# Patient Record
Sex: Male | Born: 1945 | Race: White | Hispanic: No | Marital: Married | Smoking: Former smoker
Health system: Southern US, Community
[De-identification: ages and names within clinical notes are randomized; demographics above are authoritative.]

## PROBLEM LIST (undated history)

## (undated) DIAGNOSIS — I4891 Unspecified atrial fibrillation: Secondary | ICD-10-CM

## (undated) DIAGNOSIS — I2510 Atherosclerotic heart disease of native coronary artery without angina pectoris: Secondary | ICD-10-CM

## (undated) DIAGNOSIS — I219 Acute myocardial infarction, unspecified: Secondary | ICD-10-CM

## (undated) DIAGNOSIS — E785 Hyperlipidemia, unspecified: Secondary | ICD-10-CM

## (undated) DIAGNOSIS — H9319 Tinnitus, unspecified ear: Secondary | ICD-10-CM

## (undated) DIAGNOSIS — M1990 Unspecified osteoarthritis, unspecified site: Secondary | ICD-10-CM

## (undated) DIAGNOSIS — N529 Male erectile dysfunction, unspecified: Secondary | ICD-10-CM

## (undated) DIAGNOSIS — G4733 Obstructive sleep apnea (adult) (pediatric): Secondary | ICD-10-CM

## (undated) DIAGNOSIS — K5790 Diverticulosis of intestine, part unspecified, without perforation or abscess without bleeding: Secondary | ICD-10-CM

## (undated) DIAGNOSIS — I10 Essential (primary) hypertension: Secondary | ICD-10-CM

## (undated) DIAGNOSIS — E669 Obesity, unspecified: Secondary | ICD-10-CM

## (undated) DIAGNOSIS — E119 Type 2 diabetes mellitus without complications: Secondary | ICD-10-CM

## (undated) HISTORY — PX: CORONARY ANGIOPLASTY: SHX604

## (undated) HISTORY — PX: CORONARY ARTERY BYPASS GRAFT: SHX141

## (undated) HISTORY — PX: OTHER SURGICAL HISTORY: SHX169

## (undated) HISTORY — DX: Diverticulosis of intestine, part unspecified, without perforation or abscess without bleeding: K57.90

## (undated) HISTORY — DX: Hyperlipidemia, unspecified: E78.5

## (undated) HISTORY — DX: Male erectile dysfunction, unspecified: N52.9

## (undated) HISTORY — DX: Obstructive sleep apnea (adult) (pediatric): G47.33

## (undated) HISTORY — DX: Tinnitus, unspecified ear: H93.19

## (undated) HISTORY — PX: VASECTOMY: SHX75

## (undated) HISTORY — DX: Obesity, unspecified: E66.9

## (undated) HISTORY — DX: Essential (primary) hypertension: I10

---

## 1998-08-19 ENCOUNTER — Encounter: Payer: Self-pay | Admitting: Rheumatology

## 1998-08-19 ENCOUNTER — Observation Stay (HOSPITAL_COMMUNITY): Admission: EM | Admit: 1998-08-19 | Discharge: 1998-08-20 | Payer: Self-pay | Admitting: Emergency Medicine

## 1998-08-20 ENCOUNTER — Encounter: Payer: Self-pay | Admitting: Interventional Cardiology

## 2000-04-13 ENCOUNTER — Encounter: Admission: RE | Admit: 2000-04-13 | Discharge: 2000-07-12 | Payer: Self-pay | Admitting: Internal Medicine

## 2001-12-12 ENCOUNTER — Ambulatory Visit (HOSPITAL_COMMUNITY): Admission: RE | Admit: 2001-12-12 | Discharge: 2001-12-12 | Payer: Self-pay | Admitting: Gastroenterology

## 2001-12-12 ENCOUNTER — Encounter (INDEPENDENT_AMBULATORY_CARE_PROVIDER_SITE_OTHER): Payer: Self-pay | Admitting: *Deleted

## 2007-03-22 ENCOUNTER — Encounter: Admission: RE | Admit: 2007-03-22 | Discharge: 2007-03-22 | Payer: Self-pay | Admitting: Internal Medicine

## 2009-12-16 ENCOUNTER — Inpatient Hospital Stay (HOSPITAL_BASED_OUTPATIENT_CLINIC_OR_DEPARTMENT_OTHER): Admission: RE | Admit: 2009-12-16 | Discharge: 2009-12-16 | Payer: Self-pay | Admitting: Interventional Cardiology

## 2010-08-16 LAB — POCT I-STAT GLUCOSE
Glucose, Bld: 251 mg/dL — ABNORMAL HIGH (ref 70–99)
Operator id: 141321

## 2010-09-11 ENCOUNTER — Emergency Department (HOSPITAL_COMMUNITY)
Admission: EM | Admit: 2010-09-11 | Discharge: 2010-09-11 | Disposition: A | Discharge status: Nursing Home (Includes ICF) | Payer: No Typology Code available for payment source | Attending: Emergency Medicine | Admitting: Emergency Medicine

## 2010-09-11 DIAGNOSIS — IMO0002 Reserved for concepts with insufficient information to code with codable children: Secondary | ICD-10-CM | POA: Insufficient documentation

## 2010-09-11 DIAGNOSIS — T23269A Burn of second degree of back of unspecified hand, initial encounter: Secondary | ICD-10-CM | POA: Insufficient documentation

## 2010-09-11 DIAGNOSIS — E119 Type 2 diabetes mellitus without complications: Secondary | ICD-10-CM | POA: Insufficient documentation

## 2010-09-11 DIAGNOSIS — Z79899 Other long term (current) drug therapy: Secondary | ICD-10-CM | POA: Insufficient documentation

## 2010-09-11 DIAGNOSIS — T22239A Burn of second degree of unspecified upper arm, initial encounter: Secondary | ICD-10-CM | POA: Insufficient documentation

## 2010-09-11 DIAGNOSIS — T20219A Burn of second degree of unspecified ear [any part, except ear drum], initial encounter: Secondary | ICD-10-CM | POA: Insufficient documentation

## 2010-09-11 DIAGNOSIS — Z951 Presence of aortocoronary bypass graft: Secondary | ICD-10-CM | POA: Insufficient documentation

## 2010-09-11 DIAGNOSIS — H9209 Otalgia, unspecified ear: Secondary | ICD-10-CM | POA: Insufficient documentation

## 2010-09-11 DIAGNOSIS — Z7982 Long term (current) use of aspirin: Secondary | ICD-10-CM | POA: Insufficient documentation

## 2010-09-11 DIAGNOSIS — T2020XA Burn of second degree of head, face, and neck, unspecified site, initial encounter: Secondary | ICD-10-CM | POA: Insufficient documentation

## 2010-09-11 DIAGNOSIS — Y92009 Unspecified place in unspecified non-institutional (private) residence as the place of occurrence of the external cause: Secondary | ICD-10-CM | POA: Insufficient documentation

## 2010-09-11 LAB — CBC
HCT: 43.4 % (ref 39.0–52.0)
Hemoglobin: 14.8 g/dL (ref 13.0–17.0)
MCHC: 34.1 g/dL (ref 30.0–36.0)
RBC: 4.99 MIL/uL (ref 4.22–5.81)
WBC: 9.9 10*3/uL (ref 4.0–10.5)

## 2010-09-11 LAB — POCT I-STAT, CHEM 8
Creatinine, Ser: 1 mg/dL (ref 0.4–1.5)
HCT: 46 % (ref 39.0–52.0)
Hemoglobin: 15.6 g/dL (ref 13.0–17.0)
Potassium: 4.6 mEq/L (ref 3.5–5.1)
Sodium: 139 mEq/L (ref 135–145)
TCO2: 23 mmol/L (ref 0–100)

## 2010-09-11 LAB — DIFFERENTIAL
Basophils Absolute: 0 10*3/uL (ref 0.0–0.1)
Basophils Relative: 0 % (ref 0–1)
Lymphocytes Relative: 14 % (ref 12–46)
Monocytes Absolute: 0.9 10*3/uL (ref 0.1–1.0)
Neutro Abs: 7.4 10*3/uL (ref 1.7–7.7)
Neutrophils Relative %: 75 % (ref 43–77)

## 2010-10-17 NOTE — Procedures (Signed)
[facility]  Patient:    Joshua Terrell, Joshua Terrell Visit Number: [id number] MRN: [id number]          Service Type: END Location: ENDO Attending Physician:  Dennison Bulla Ii Dictated by:   Verlin Grills, M.D. Proc. Date: 12/12/01 Admit Date:  12/12/2001 Discharge Date: 12/12/2001   CC:         Thora Lance, M.D.   Procedure Report  REFERRING PHYSICIAN:  Thora Lance, M.D.  PROCEDURE:  Screening colonoscopy with rectal polypectomy.  PROCEDURE INDICATION:  Joshua Terrell is a 65 year old male born [birth year].  Joshua Terrell is referred for his first screening colonoscopy with polypectomy to prevent colon cancer.  I discussed with Joshua Terrell the complications associated with colonoscopy and polypectomy, including a 15 per thousand risk of bleeding and four per thousand risk for colon perforation requiring surgical repair.  Joshua Terrell has signed the operative permit.  ENDOSCOPIST:  Verlin Grills, M.D.  PREMEDICATION:  Versed 10 mg, Demerol 100 mg.  ENDOSCOPE:  Olympus pediatric colonoscope.  DESCRIPTION OF PROCEDURE:  After obtaining informed consent, Joshua Terrell was placed in the left lateral decubitus position.  I administered intravenous Demerol and intravenous Versed to achieve conscious sedation for the procedure.  The patients blood pressure, oxygen saturation, and cardiac rhythm were monitored throughout the procedure and documented in the medical record.  Anal inspection was normal.  Digital rectal exam revealed a non-nodular prostate.  The Olympus pediatric video colonoscope was introduced into the rectum and easily advanced to the cecum.  Colonic preparation for the exam today was excellent.  Rectum:  From the midrectum a 1 mm sessile polyp was removed with the cold biopsy forceps.  Sigmoid colon and descending colon:  Left colonic diverticulosis.  Splenic flexure normal.  Transverse colon normal.  Hepatic  flexure normal.  Ascending colon normal.  Cecum and ileocecal valve normal.  ASSESSMENT: 1. Left colonic diverticulosis. 2. From the midrectum a 1 mm sessile polyp was removed with the cold biopsy    forceps.  RECOMMENDATIONS:  Await rectal polyp pathology. Dictated by:   Verlin Grills, M.D. Attending Physician:  Dennison Bulla Ii DD:  12/12/01 TD:  12/14/01 Job: [id number]

## 2011-12-14 ENCOUNTER — Other Ambulatory Visit: Payer: Self-pay | Admitting: Internal Medicine

## 2011-12-14 DIAGNOSIS — K859 Acute pancreatitis without necrosis or infection, unspecified: Secondary | ICD-10-CM

## 2011-12-15 ENCOUNTER — Ambulatory Visit
Admission: RE | Admit: 2011-12-15 | Discharge: 2011-12-15 | Disposition: A | Discharge status: Home / Self Care | Payer: Medicare Other | Source: Ambulatory Visit | Attending: Internal Medicine | Admitting: Internal Medicine

## 2011-12-15 DIAGNOSIS — K859 Acute pancreatitis without necrosis or infection, unspecified: Secondary | ICD-10-CM

## 2013-02-06 ENCOUNTER — Other Ambulatory Visit: Payer: Self-pay | Admitting: Interventional Cardiology

## 2013-02-09 ENCOUNTER — Encounter (HOSPITAL_BASED_OUTPATIENT_CLINIC_OR_DEPARTMENT_OTHER): Payer: Self-pay | Admitting: *Deleted

## 2013-02-09 ENCOUNTER — Encounter (HOSPITAL_BASED_OUTPATIENT_CLINIC_OR_DEPARTMENT_OTHER)
Admission: RE | Disposition: A | Discharge status: Home / Self Care | Payer: Self-pay | Source: Ambulatory Visit | Attending: Interventional Cardiology

## 2013-02-09 ENCOUNTER — Inpatient Hospital Stay (HOSPITAL_BASED_OUTPATIENT_CLINIC_OR_DEPARTMENT_OTHER)
Admission: RE | Admit: 2013-02-09 | Discharge: 2013-02-09 | Disposition: A | Discharge status: Home / Self Care | Payer: Medicare Other | Source: Ambulatory Visit | Attending: Interventional Cardiology | Admitting: Interventional Cardiology

## 2013-02-09 DIAGNOSIS — I2510 Atherosclerotic heart disease of native coronary artery without angina pectoris: Secondary | ICD-10-CM | POA: Insufficient documentation

## 2013-02-09 DIAGNOSIS — I25810 Atherosclerosis of coronary artery bypass graft(s) without angina pectoris: Secondary | ICD-10-CM | POA: Diagnosis present

## 2013-02-09 DIAGNOSIS — I209 Angina pectoris, unspecified: Secondary | ICD-10-CM | POA: Insufficient documentation

## 2013-02-09 DIAGNOSIS — I5030 Unspecified diastolic (congestive) heart failure: Secondary | ICD-10-CM | POA: Insufficient documentation

## 2013-02-09 DIAGNOSIS — I2582 Chronic total occlusion of coronary artery: Secondary | ICD-10-CM | POA: Insufficient documentation

## 2013-02-09 HISTORY — DX: Atherosclerotic heart disease of native coronary artery without angina pectoris: I25.10

## 2013-02-09 HISTORY — DX: Type 2 diabetes mellitus without complications: E11.9

## 2013-02-09 SURGERY — JV LEFT HEART CATHETERIZATION WITH CORONARY/GRAFT ANGIOGRAM

## 2013-02-09 MED ORDER — SODIUM CHLORIDE 0.9 % IJ SOLN: 3.0000 mL | INTRAMUSCULAR | Status: DC | PRN

## 2013-02-09 MED ORDER — SODIUM CHLORIDE 0.9 % IJ SOLN: 3.0000 mL | Freq: Two times a day (BID) | INTRAMUSCULAR | Status: DC

## 2013-02-09 MED ORDER — SODIUM CHLORIDE 0.9 % IV SOLN: 250.0000 mL | INTRAVENOUS | Status: DC | PRN

## 2013-02-09 MED ORDER — ONDANSETRON HCL 4 MG/2ML IJ SOLN: 4.0000 mg | Freq: Four times a day (QID) | INTRAMUSCULAR | Status: DC | PRN

## 2013-02-09 MED ORDER — ACETAMINOPHEN 325 MG PO TABS: 650.0000 mg | ORAL_TABLET | ORAL | Status: DC | PRN

## 2013-02-09 MED ORDER — DIAZEPAM 5 MG PO TABS
5.0000 mg | ORAL_TABLET | ORAL | Status: AC
  Administered 2013-02-09: 5 mg via ORAL

## 2013-02-09 MED ORDER — ASPIRIN 81 MG PO CHEW
324.0000 mg | CHEWABLE_TABLET | ORAL | Status: AC
  Administered 2013-02-09: 324 mg via ORAL

## 2013-02-09 MED ORDER — SODIUM CHLORIDE 0.9 % IV SOLN: INTRAVENOUS | Status: DC

## 2013-02-09 MED ORDER — SODIUM CHLORIDE 0.9 % IV SOLN
INTRAVENOUS | Status: DC
  Administered 2013-02-09: 09:00:00 via INTRAVENOUS

## 2013-02-09 NOTE — H&P (Signed)
  Has history of CAD and CABG with recent complaints suggestive of angina. No newCath Lab Visit (complete for each Cath Lab visit)  Clinical Evaluation Leading to the Procedure:   ACS: no  Non-ACS:    Anginal Classification: CCS III  Anti-ischemic medical therapy: Minimal Therapy (1 class of medications)  Non-Invasive Test Results: No non-invasive testing performed  Prior CABG: Previous CABG       complaints.

## 2013-02-09 NOTE — Progress Notes (Signed)
Pt received from cath procedure alert and denies any pain at this time.  Pt able to tolerate fluids. Dr Katrinka Blazing in to talk to pt and family about the results of test and plan of care.

## 2013-02-09 NOTE — CV Procedure (Signed)
     Diagnostic Cardiac Catheterization Report  Joshua Terrell  67 y.o.  male Sep 23, 1945  Procedure Date: 02/09/2013 Referring Physician: Kirby Funk M.D. Primary Cardiologist:: HWB Leia Alf, M.D.   PROCEDURE:  Left heart catheterization with selective coronary angiography, left ventriculogram.  INDICATIONS:  He is experienced exertional dyspnea and chest tightness with physical activity. This is identical to symptoms noted prior to coronary bypass grafting. Studies being done to document graft patency and help guide therapy.  The risks, benefits, and details of the procedure were explained to the patient.  The patient verbalized understanding and wanted to proceed.  Informed written consent was obtained.  PROCEDURE TECHNIQUE:  After Xylocaine anesthesia a 4 French sheath was placed in the right femoral artery with a single anterior needle wall stick.   Coronary angiography was done using a 4 French left and right Judkins, and an A2 multipurpose catheter.  Left ventriculography was done using an A2 NP catheter.    CONTRAST:  Total of 100 cc.  COMPLICATIONS:  None.    HEMODYNAMICS:  Aortic pressure was 125/73 mmHg; LV pressure was 129 over 19 mmHg; LVEDP 26 mmHg.  There was no gradient between the left ventricle and aorta.    ANGIOGRAPHIC DATA:   The left main coronary artery is widely patent.  The left anterior descending artery is totally occluded beyond the second septal perforator.  The left circumflex artery is totally occluded proximally..  The right coronary artery is totally occluded proximally.Marland Kitchen  LEFT VENTRICULOGRAM:  Left ventricular angiogram was done in the 30 RAO projection and revealed normal left ventricular wall motion and systolic function with an estimated ejection fraction of 50% %. Faint opacification was due to hand injection. LVEDP was elevated.  BYPASS GRAFTS:  Saphenous vein graft to the distal obtuse marginal: Widely patent.  Saphenous vein graft  to the distal right coronary: Widely patent  Saphenous vein graft to the diagonal: The graft is widely patent. The diagonal seems to also supply the mid to distal LAD in a retrograde fashion.  LIMA to LAD: Atretic  IMPRESSIONS:  1. Severe native vessel coronary disease with total occlusion of the mid LAD, proximal circumflex, and proximal RCA.  2. Patent saphenous vein grafts as noted above. X-rays of the left internal mammary graft to the LAD.  3. Low normal LV systolic function with elevated end-diastolic pressure compatible with diastolic heart failure.   RECOMMENDATION:  Medical therapy. Institute treatment targeted at diastolic dysfunction.

## 2013-02-10 LAB — POCT I-STAT GLUCOSE: Operator id: 274661

## 2013-03-15 ENCOUNTER — Ambulatory Visit: Payer: Medicare Other | Admitting: Interventional Cardiology

## 2013-03-16 ENCOUNTER — Encounter: Payer: Self-pay | Admitting: *Deleted

## 2013-03-16 ENCOUNTER — Encounter: Payer: Self-pay | Admitting: Interventional Cardiology

## 2013-03-20 ENCOUNTER — Encounter (INDEPENDENT_AMBULATORY_CARE_PROVIDER_SITE_OTHER): Payer: Self-pay

## 2013-03-20 ENCOUNTER — Ambulatory Visit (INDEPENDENT_AMBULATORY_CARE_PROVIDER_SITE_OTHER): Payer: Medicare Other | Admitting: Interventional Cardiology

## 2013-03-20 ENCOUNTER — Encounter: Payer: Self-pay | Admitting: Interventional Cardiology

## 2013-03-20 VITALS — BP 150/82 | HR 80 | Ht 73.0 in | Wt 305.0 lb

## 2013-03-20 DIAGNOSIS — I25810 Atherosclerosis of coronary artery bypass graft(s) without angina pectoris: Secondary | ICD-10-CM

## 2013-03-20 DIAGNOSIS — I5032 Chronic diastolic (congestive) heart failure: Secondary | ICD-10-CM | POA: Insufficient documentation

## 2013-03-20 DIAGNOSIS — I10 Essential (primary) hypertension: Secondary | ICD-10-CM | POA: Insufficient documentation

## 2013-03-20 DIAGNOSIS — I209 Angina pectoris, unspecified: Secondary | ICD-10-CM

## 2013-03-20 NOTE — Progress Notes (Signed)
Patient ID: Joshua Terrell, male   DOB: 01/09/1946, 67 y.o.   MRN: [id number]    [address] [place], [place]  [address] Phone: [phone] Fax:  [phone]  Date:  03/20/2013   ID:  Joshua Terrell, DOB [birth year], MRN [id number]  PCP:  Lillia Mountain, MD   ASSESSMENT:  1. Coronary atherosclerosis without angina  2. Chronic diastolic heart failure, improved on increased diuretic therapy  3. Hypertension, controlled  4. Diabetes, poorly controlled  5. Morbid obesity  PLAN:  1. increase physical activity, decreased caloric intake, in attempts to lose weight  2. Continue current medical regimen  3. Attempt to improve control of diabetes.  4. Six-month clinical followup   SUBJECTIVE: Joshua Terrell is a 67 y.o. male who is status post catheterization. He has atresia of the LIMA to the LAD. Other grafts are patent. No high-grade proximal disease was felt to be present that requires PCI. Medication adjustments to manage diastolic heart failure were initiated. This included adding spiral lactone. There has been improvement in exertional dyspnea on this medication. He denies angina. There's been a 4 pound weight loss. He is trying to lose weight otherwise but has been unsuccessful..   Wt Readings from Last 3 Encounters:  03/20/13 305 lb (138.347 kg)  02/09/13 309 lb (140.161 kg)  02/09/13 309 lb (140.161 kg)     Past Medical History  Diagnosis Date  . Coronary artery disease     PTCA of the codominant LAD, 1992, CABG July 94  . Diabetes mellitus without complication   . HTN (hypertension)   . Erectile dysfunction   . Tinnitus   . Diverticulosis   . Hyperlipidemia   . Obesity   . OSA (obstructive sleep apnea)     Current Outpatient Prescriptions  Medication Sig Dispense Refill  . amLODipine (NORVASC) 5 MG tablet Take 1 tablet by mouth daily.      Marland Kitchen atorvastatin (LIPITOR) 80 MG tablet Take 1 tablet by mouth daily.      Marland Kitchen glimepiride (AMARYL) 4  MG tablet Take 2 tablets by mouth daily.      Marland Kitchen HYDROcodone-acetaminophen (NORCO/VICODIN) 5-325 MG per tablet TAKE ONE TABLET PRN      . lisinopril-hydrochlorothiazide (PRINZIDE,ZESTORETIC) 20-25 MG per tablet TAKE ONE TABLET DAILY      . metFORMIN (GLUCOPHAGE) 1000 MG tablet Take 1 tablet by mouth 2 (two) times daily.      . metoprolol succinate (TOPROL-XL) 50 MG 24 hr tablet Take 2 tablets by mouth daily.      . Multiple Vitamin (MULTIVITAMIN) capsule Take 1 capsule by mouth daily.      Marland Kitchen spironolactone (ALDACTONE) 25 MG tablet TAKE ONE TABLET DAILY       No current facility-administered medications for this visit.    Allergies:   No Known Allergies  Social History:  The patient  reports that he has quit smoking. He does not have any smokeless tobacco history on file.   ROS:  Please see the history of present illness.   No blood in stool or urine. No edema. No orthopnea PND. Breathing improved since current medication adjustment per   All other systems reviewed and negative.   OBJECTIVE: VS:  BP 150/82  Pulse 80  Ht 6\' 1"  (1.854 m)  Wt 305 lb (138.347 kg)  BMI 40.25 kg/m2  SpO2 94% Well nourished, well developed, in no acute distress HEENT: normal Neck: JVD none. Carotid bruit none  Cardiac:  normal S1,  S2; RRR; no murmur Lungs:  clear to auscultation bilaterally, no wheezing, rhonchi or rales Abd: soft, nontender, no hepatomegaly Ext: Edema none. 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

## 2013-03-20 NOTE — Patient Instructions (Signed)
Your physician recommends that you continue on your current medications as directed. Please refer to the Current Medication list given to you today.  Dr. Katrinka Blazing wants the patient to lower caloric intake and start exercise for weight management.  Your physician wants you to follow-up in: 6 months with Dr. Katrinka Blazing. You will receive a reminder letter in the mail two months in advance. If you don't receive a letter, please call our office to schedule the follow-up appointment.

## 2013-06-09 ENCOUNTER — Other Ambulatory Visit: Payer: Self-pay | Admitting: Orthopaedic Surgery

## 2013-06-09 DIAGNOSIS — IMO0002 Reserved for concepts with insufficient information to code with codable children: Secondary | ICD-10-CM

## 2013-06-20 ENCOUNTER — Ambulatory Visit
Admission: RE | Admit: 2013-06-20 | Discharge: 2013-06-20 | Disposition: A | Discharge status: Home / Self Care | Payer: Medicare HMO | Source: Ambulatory Visit | Attending: Orthopaedic Surgery | Admitting: Orthopaedic Surgery

## 2013-06-20 DIAGNOSIS — IMO0002 Reserved for concepts with insufficient information to code with codable children: Secondary | ICD-10-CM

## 2013-07-24 ENCOUNTER — Other Ambulatory Visit: Payer: Self-pay | Admitting: Interventional Cardiology

## 2013-08-24 ENCOUNTER — Other Ambulatory Visit: Payer: Self-pay | Admitting: Interventional Cardiology

## 2013-08-24 ENCOUNTER — Telehealth: Payer: Self-pay

## 2013-08-24 NOTE — Telephone Encounter (Signed)
pt metoprolol dosage confirmed with pt wife.Metoprolol Succ 50mg  tab 1 1/2 tab(total 75mg  daily)

## 2013-08-24 NOTE — Telephone Encounter (Signed)
Should this be 1.5 daily or 2 daily like is on the chart? Please advise. Thanks, MI

## 2013-12-29 ENCOUNTER — Other Ambulatory Visit: Payer: Self-pay | Admitting: Interventional Cardiology

## 2014-01-01 ENCOUNTER — Other Ambulatory Visit: Payer: Self-pay

## 2014-01-01 MED ORDER — SPIRONOLACTONE 25 MG PO TABS: ORAL_TABLET | ORAL | Status: DC

## 2014-02-22 ENCOUNTER — Ambulatory Visit (INDEPENDENT_AMBULATORY_CARE_PROVIDER_SITE_OTHER): Payer: Medicare HMO | Admitting: Interventional Cardiology

## 2014-02-22 ENCOUNTER — Encounter: Payer: Self-pay | Admitting: Interventional Cardiology

## 2014-02-22 VITALS — BP 110/70 | HR 85 | Ht 73.0 in | Wt 297.0 lb

## 2014-02-22 DIAGNOSIS — I10 Essential (primary) hypertension: Secondary | ICD-10-CM

## 2014-02-22 DIAGNOSIS — I5032 Chronic diastolic (congestive) heart failure: Secondary | ICD-10-CM

## 2014-02-22 DIAGNOSIS — E1165 Type 2 diabetes mellitus with hyperglycemia: Secondary | ICD-10-CM

## 2014-02-22 DIAGNOSIS — IMO0001 Reserved for inherently not codable concepts without codable children: Secondary | ICD-10-CM

## 2014-02-22 DIAGNOSIS — G4733 Obstructive sleep apnea (adult) (pediatric): Secondary | ICD-10-CM

## 2014-02-22 DIAGNOSIS — I209 Angina pectoris, unspecified: Secondary | ICD-10-CM

## 2014-02-22 DIAGNOSIS — I25810 Atherosclerosis of coronary artery bypass graft(s) without angina pectoris: Secondary | ICD-10-CM

## 2014-02-22 NOTE — Progress Notes (Signed)
Patient ID: Joshua Terrell, male   DOB: 01-10-1946, 68 y.o.   MRN: [id number]    [address], [address] [place], [place]  [address] Phone: [phone] Fax:  [phone]  Date:  02/22/2014   ID:  KORD MONETTE, DOB 1945/07/11, MRN [id number]  PCP:  Irven Shelling, MD   ASSESSMENT:  1. Coronary atherosclerotic heart disease, stable without angina. Most recent cardiac catheterization 2012 2. Severe obstructive sleep apnea treated with CPAP 3. Hypertension, controlled 4. Type 2 diabetes, uncontrolled, with recent addition of Bydureon 5. Hyperlipidemia with LDL at target in May  PLAN:  1. Continue current cardiac therapy 2. Increase physical activity and lose weight 3. Clinical followup in one year 4. He should call if angina   SUBJECTIVE: Joshua Terrell is a 68 y.o. male who is doing relatively well. He is limited by significant lower back discomfort. Despite this he maintains a relatively active lifestyle working in his yard and hunting out in the woods. He has not had orthopnea, PND, or palpitations. He denies edema. He has not had syncope. No features of his lower problems found like claudication.   Wt Readings from Last 3 Encounters:  02/22/14 297 lb (134.718 kg)  03/20/13 305 lb (138.347 kg)  02/09/13 309 lb (140.161 kg)     Past Medical History  Diagnosis Date  . Coronary artery disease     PTCA of the codominant LAD, 1992, CABG July 94  . Diabetes mellitus without complication   . HTN (hypertension)   . Erectile dysfunction   . Tinnitus   . Diverticulosis   . Hyperlipidemia   . Obesity   . OSA (obstructive sleep apnea)     Current Outpatient Prescriptions  Medication Sig Dispense Refill  . amLODipine (NORVASC) 5 MG tablet Take 1 tablet by mouth daily.      Marland Kitchen aspirin 81 MG tablet Take 81 mg by mouth daily.      Marland Kitchen atorvastatin (LIPITOR) 80 MG tablet Take 1 tablet by mouth daily.      Marland Kitchen HYDROcodone-acetaminophen (NORCO/VICODIN) 5-325 MG per tablet TAKE  ONE TABLET PRN      . lisinopril-hydrochlorothiazide (PRINZIDE,ZESTORETIC) 20-25 MG per tablet TAKE ONE TABLET DAILY      . metFORMIN (GLUCOPHAGE) 1000 MG tablet Take 1 tablet by mouth 2 (two) times daily.      . metoprolol succinate (TOPROL-XL) 50 MG 24 hr tablet TAKE 1 AND 1/2 TABLETS BY MOUTH ONCE DAILY  45 tablet  8  . Multiple Vitamin (MULTIVITAMIN) capsule Take 1 capsule by mouth daily.      Marland Kitchen spironolactone (ALDACTONE) 25 MG tablet TAKE 1 TABLET BY MOUTH ONCE DAILY  90 tablet  0   No current facility-administered medications for this visit.    Allergies:   No Known Allergies  Social History:  The patient  reports that he has quit smoking. He does not have any smokeless tobacco history on file.   ROS:  Please see the history of present illness.   No transient neurological symptoms. No blood in urine or stool.   All other systems reviewed and negative.   OBJECTIVE: VS:  BP 110/70  Pulse 85  Ht 6\' 1"  (1.854 m)  Wt 297 lb (134.718 kg)  BMI 39.19 kg/m2 Well nourished, well developed, in no acute distress, obese HEENT: normal Neck: JVD flat. Carotid bruit absent  Cardiac:  normal S1, S2; RRR; no murmur Lungs:  clear to auscultation bilaterally, no wheezing, rhonchi or rales Abd:  soft, nontender, no hepatomegaly Ext: Edema absent. Pulses 2+ Skin: warm and dry Neuro:  CNs 2-12 intact, no focal abnormalities noted  EKG:  Normal with first degree AV block       Signed, Illene Labrador III, MD 02/22/2014 2:37 PM

## 2014-02-22 NOTE — Patient Instructions (Signed)
Your physician recommends that you continue on your current medications as directed. Please refer to the Current Medication list given to you today.  Your physician wants you to follow-up in: 1 year with Dr.Smith You will receive a reminder letter in the mail two months in advance. If you don't receive a letter, please call our office to schedule the follow-up appointment.  

## 2014-05-01 ENCOUNTER — Other Ambulatory Visit: Payer: Self-pay | Admitting: Interventional Cardiology

## 2014-06-06 ENCOUNTER — Other Ambulatory Visit: Payer: Self-pay | Admitting: Interventional Cardiology

## 2014-08-29 ENCOUNTER — Other Ambulatory Visit: Payer: Self-pay | Admitting: Interventional Cardiology

## 2014-12-31 ENCOUNTER — Other Ambulatory Visit: Payer: Self-pay | Admitting: Interventional Cardiology

## 2015-02-23 ENCOUNTER — Other Ambulatory Visit: Payer: Self-pay | Admitting: Interventional Cardiology

## 2015-04-29 ENCOUNTER — Other Ambulatory Visit: Payer: Self-pay | Admitting: Interventional Cardiology

## 2015-04-30 ENCOUNTER — Other Ambulatory Visit: Payer: Self-pay | Admitting: Interventional Cardiology

## 2015-05-03 ENCOUNTER — Other Ambulatory Visit: Payer: Self-pay | Admitting: Interventional Cardiology

## 2015-05-14 ENCOUNTER — Ambulatory Visit (INDEPENDENT_AMBULATORY_CARE_PROVIDER_SITE_OTHER): Payer: PPO | Admitting: Interventional Cardiology

## 2015-05-14 ENCOUNTER — Encounter: Payer: Self-pay | Admitting: Interventional Cardiology

## 2015-05-14 VITALS — BP 132/84 | HR 83 | Ht 73.0 in | Wt 299.6 lb

## 2015-05-14 DIAGNOSIS — I25810 Atherosclerosis of coronary artery bypass graft(s) without angina pectoris: Secondary | ICD-10-CM | POA: Diagnosis not present

## 2015-05-14 DIAGNOSIS — I10 Essential (primary) hypertension: Secondary | ICD-10-CM | POA: Diagnosis not present

## 2015-05-14 DIAGNOSIS — G4733 Obstructive sleep apnea (adult) (pediatric): Secondary | ICD-10-CM

## 2015-05-14 DIAGNOSIS — I5032 Chronic diastolic (congestive) heart failure: Secondary | ICD-10-CM | POA: Diagnosis not present

## 2015-05-14 NOTE — Progress Notes (Signed)
Cardiology Office Note   Date:  05/14/2015   ID:  Joshua Terrell, DOB Oct 25, 1945, MRN [id number]  PCP:  Irven Shelling, MD  Cardiologist:  [name], MD   Chief Complaint  Patient presents with  . Coronary Artery Disease       History of Present Illness: Joshua Terrell is a 69 y.o. male who presents for CAD with CABG 1994 including SVG to OM, SVG to RCA, and SVG to diagonal. LIMA to LAD is atretic.  He is discouraged about weight gain. He denies angina. He has not had syncope. He does have sleep apnea and wears C Pap. He is encouraged to continue to wear his device.  We discussed daily physical activity as much as tolerable depending upon his back.  He has occasional fleeting chest pain but none of anginal quality.  Past Medical History  Diagnosis Date  . Coronary artery disease     PTCA of the codominant LAD, 1992, CABG July 94  . Diabetes mellitus without complication ([place])   . HTN (hypertension)   . Erectile dysfunction   . Tinnitus   . Diverticulosis   . Hyperlipidemia   . Obesity   . OSA (obstructive sleep apnea)     Past Surgical History  Procedure Laterality Date  . Coronary angioplasty    . Coronary artery bypass graft    . Vasectomy       Current Outpatient Prescriptions  Medication Sig Dispense Refill  . amLODipine (NORVASC) 5 MG tablet Take 1 tablet by mouth daily.    Marland Kitchen aspirin 81 MG tablet Take 81 mg by mouth daily.    Marland Kitchen atorvastatin (LIPITOR) 80 MG tablet Take 1 tablet by mouth daily.    . Exenatide ER (BYDUREON) 2 MG PEN Inject 2 mg into the skin once a week.    Marland Kitchen HYDROcodone-acetaminophen (NORCO/VICODIN) 5-325 MG per tablet TAKE ONE TABLET PRN    . lisinopril-hydrochlorothiazide (PRINZIDE,ZESTORETIC) 20-25 MG per tablet TAKE ONE TABLET DAILY    . metFORMIN (GLUCOPHAGE) 1000 MG tablet Take 1 tablet by mouth 2 (two) times daily.    . metoprolol succinate (TOPROL-XL) 50 MG 24 hr tablet TAKE 1 AND 1/2 TABLETS BY MOUTH ONCE DAILY 45  tablet 0  . Multiple Vitamin (MULTIVITAMIN) capsule Take 1 capsule by mouth daily.    Marland Kitchen spironolactone (ALDACTONE) 25 MG tablet TAKE 1 TABLET BY MOUTH ONCE DAILY 90 tablet 0   No current facility-administered medications for this visit.    Allergies:   Review of patient's allergies indicates no known allergies.    Social History:  The patient  reports that he has quit smoking. He has never used smokeless tobacco.   Family History:  The patient's family history includes Healthy in his mother, sister, and sister; Heart attack in his father; Heart disease in his father; Other in his brother.    ROS:  Please see the history of present illness.   Otherwise, review of systems are positive for excessive fatigue, dyspnea on exertion, low back discomfort that limits mobility..   All other systems are reviewed and negative.     PHYSICAL EXAM: VS:  BP 132/84 mmHg  Pulse 83  Ht 6\' 1"  (1.854 m)  Wt 299 lb 9.6 oz (135.898 kg)  BMI 39.54 kg/m2 , BMI Body mass index is 39.54 kg/(m^2). GEN: Well nourished, well developed, in no acute distress. Morbidly obese HEENT: normal Neck: no JVD, carotid bruits, or masses Cardiac: RRR.  There is no murmur,  rub, or gallop. There is no edema. Respiratory:  clear to auscultation bilaterally, normal work of breathing. GI: soft, nontender, nondistended, + BS MS: no deformity or atrophy Skin: warm and dry, no rash Neuro:  Strength and sensation are intact Psych: euthymic mood, full affect   EKG:  EKG is performed and reveals sinus rhythm with first-degree AV block (232 ms). Otherwise unremarkable.   Recent Labs: No results found for requested labs within last 365 days.    Lipid Panel No results found for: CHOL, TRIG, HDL, CHOLHDL, VLDL, LDLCALC, LDLDIRECT    Wt Readings from Last 3 Encounters:  05/14/15 299 lb 9.6 oz (135.898 kg)  02/22/14 297 lb (134.718 kg)  03/20/13 305 lb (138.347 kg)      Other studies Reviewed: Additional studies/ records  that were reviewed today include: Reviewed all electronic health records. The findings include no new data is found..    ASSESSMENT AND PLAN:  1. Coronary artery disease involving coronary bypass graft of native heart without angina pectoris Asymptomatic with reference to angina. Known to have an atretic LAD. Cath last performed 2014.  2. Essential hypertension, benign Excellent control  3. Chronic diastolic heart failure (HCC) No evidence of volume overload  4. Severe obstructive sleep apnea Obese. Compliant with therapy.    Current medicines are reviewed at length with the patient today.  The patient has the following concerns regarding medicines: None. He is concerned about his weight..  The following changes/actions have been instituted:    Decrease carbohydrates and diet. Increase physical activity.  Weight loss.  Labs/ tests ordered today include:  No orders of the defined types were placed in this encounter.     Disposition:   FU with HS in 1 year  Signed, [name], MD  05/14/2015 9:02 AM    [facility] [place], [place], [place]  [address] Phone: [phone]; Fax: [phone]

## 2015-05-14 NOTE — Patient Instructions (Signed)
Medication Instructions:  Your physician recommends that you continue on your current medications as directed. Please refer to the Current Medication list given to you today.   Labwork: None ordered  Testing/Procedures: None ordered  Follow-Up: Your physician wants you to follow-up in: 1 year with Dr.Smith You will receive a reminder letter in the mail two months in advance. If you don't receive a letter, please call our office to schedule the follow-up appointment.   Any Other Special Instructions Will Be Listed Below (If Applicable). Your physician discussed the importance of regular exercise and recommended that you start or continue a regular exercise program for good health.  Your physician encouraged you to lose weight for better health.  Avoid foods that are white i.e. Potatoes,Bread, Rice, Pasta      If you need a refill on your cardiac medications before your next appointment, please call your pharmacy.

## 2015-05-25 ENCOUNTER — Other Ambulatory Visit: Payer: Self-pay | Admitting: Interventional Cardiology

## 2015-06-14 DIAGNOSIS — G4733 Obstructive sleep apnea (adult) (pediatric): Secondary | ICD-10-CM | POA: Diagnosis not present

## 2015-06-14 DIAGNOSIS — I10 Essential (primary) hypertension: Secondary | ICD-10-CM | POA: Diagnosis not present

## 2015-06-14 DIAGNOSIS — Z794 Long term (current) use of insulin: Secondary | ICD-10-CM | POA: Diagnosis not present

## 2015-06-14 DIAGNOSIS — E1165 Type 2 diabetes mellitus with hyperglycemia: Secondary | ICD-10-CM | POA: Diagnosis not present

## 2015-06-14 DIAGNOSIS — M5136 Other intervertebral disc degeneration, lumbar region: Secondary | ICD-10-CM | POA: Diagnosis not present

## 2015-07-03 DIAGNOSIS — M25511 Pain in right shoulder: Secondary | ICD-10-CM | POA: Diagnosis not present

## 2015-07-25 ENCOUNTER — Other Ambulatory Visit: Payer: Self-pay | Admitting: Interventional Cardiology

## 2015-08-07 DIAGNOSIS — M4806 Spinal stenosis, lumbar region: Secondary | ICD-10-CM | POA: Diagnosis not present

## 2015-08-07 DIAGNOSIS — M5440 Lumbago with sciatica, unspecified side: Secondary | ICD-10-CM | POA: Diagnosis not present

## 2015-08-07 DIAGNOSIS — M25511 Pain in right shoulder: Secondary | ICD-10-CM | POA: Diagnosis not present

## 2015-10-08 DIAGNOSIS — R21 Rash and other nonspecific skin eruption: Secondary | ICD-10-CM | POA: Diagnosis not present

## 2015-10-08 DIAGNOSIS — K591 Functional diarrhea: Secondary | ICD-10-CM | POA: Diagnosis not present

## 2015-10-21 DIAGNOSIS — Z7984 Long term (current) use of oral hypoglycemic drugs: Secondary | ICD-10-CM | POA: Diagnosis not present

## 2015-10-21 DIAGNOSIS — Z1389 Encounter for screening for other disorder: Secondary | ICD-10-CM | POA: Diagnosis not present

## 2015-10-21 DIAGNOSIS — B356 Tinea cruris: Secondary | ICD-10-CM | POA: Diagnosis not present

## 2015-10-21 DIAGNOSIS — E119 Type 2 diabetes mellitus without complications: Secondary | ICD-10-CM | POA: Diagnosis not present

## 2015-10-21 DIAGNOSIS — E7800 Pure hypercholesterolemia, unspecified: Secondary | ICD-10-CM | POA: Diagnosis not present

## 2015-10-21 DIAGNOSIS — Z6841 Body Mass Index (BMI) 40.0 and over, adult: Secondary | ICD-10-CM | POA: Diagnosis not present

## 2015-10-21 DIAGNOSIS — I10 Essential (primary) hypertension: Secondary | ICD-10-CM | POA: Diagnosis not present

## 2015-10-21 DIAGNOSIS — M5136 Other intervertebral disc degeneration, lumbar region: Secondary | ICD-10-CM | POA: Diagnosis not present

## 2015-10-21 DIAGNOSIS — Z0000 Encounter for general adult medical examination without abnormal findings: Secondary | ICD-10-CM | POA: Diagnosis not present

## 2015-10-21 DIAGNOSIS — E1165 Type 2 diabetes mellitus with hyperglycemia: Secondary | ICD-10-CM | POA: Diagnosis not present

## 2015-11-12 ENCOUNTER — Encounter: Payer: Self-pay | Admitting: Cardiology

## 2015-11-12 DIAGNOSIS — R0789 Other chest pain: Secondary | ICD-10-CM | POA: Diagnosis not present

## 2015-11-12 DIAGNOSIS — I4891 Unspecified atrial fibrillation: Secondary | ICD-10-CM | POA: Diagnosis not present

## 2015-11-12 DIAGNOSIS — R0609 Other forms of dyspnea: Secondary | ICD-10-CM | POA: Diagnosis not present

## 2015-11-12 DIAGNOSIS — I4892 Unspecified atrial flutter: Secondary | ICD-10-CM | POA: Diagnosis not present

## 2015-11-12 DIAGNOSIS — I959 Hypotension, unspecified: Secondary | ICD-10-CM | POA: Diagnosis not present

## 2015-11-12 NOTE — Progress Notes (Addendum)
Cardiology Office Note   Date:  11/13/2015   ID:  Joshua Terrell, DOB 04-07-1946, MRN [id number]  PCP:  Irven Shelling, MD  Cardiologist:  Dr. Tamala Julian    Chief Complaint  Patient presents with  . Fatigue    chest tightness, hypotension, a flutter.       History of Present Illness: Joshua Terrell is a 70 y.o. male who presents for atrial flutter.  Dr. Laurann Montana referred him to [place].   He has a hx. Of CAD with CABG 1994 including SVG to OM, SVG to RCA, and SVG to diagonal last cath 2014. LIMA to LAD is atretic. He does have sleep apnea and wears C Pap.    Has done very well since CABG until SAT. Was [place] and with walking became dizzy and weak.  No chest tightness then.  He went home and BP 70 systolic.  He was drinkng lots of fluids.   He was seen by Dr. Laurann Montana yesterday and his amlodipine, and spironolactone were stopped.  His toprol was decreased from 75 to 50 mg daily.  He was started on Eliquis for CHA2DS2VASc score of 4.  No recent history of any bleeding.    Today he is feeling much better though still weak but his "brain fog" has cleared.  Still with some weakness and chest tightness.        Past Medical History  Diagnosis Date  . Coronary artery disease     PTCA of the codominant LAD, 1992, CABG July 94  . Diabetes mellitus without complication ([place])   . HTN (hypertension)   . Erectile dysfunction   . Tinnitus   . Diverticulosis   . Hyperlipidemia   . Obesity   . OSA (obstructive sleep apnea)     Past Surgical History  Procedure Laterality Date  . Coronary angioplasty    . Coronary artery bypass graft    . Vasectomy       Current Outpatient Prescriptions  Medication Sig Dispense Refill  . apixaban (ELIQUIS) 5 MG TABS tablet Take 5 mg by mouth 2 (two) times daily.    Marland Kitchen atorvastatin (LIPITOR) 80 MG tablet Take 1 tablet by mouth daily.    . Exenatide ER (BYDUREON) 2 MG PEN Inject 2 mg into the skin once a week.    Marland Kitchen lisinopril-hydrochlorothiazide  (PRINZIDE,ZESTORETIC) 20-12.5 MG tablet Take 1 tablet by mouth daily.    . metFORMIN (GLUCOPHAGE) 1000 MG tablet Take 0.5 tablets by mouth 2 (two) times daily.     . metoprolol succinate (TOPROL-XL) 50 MG 24 hr tablet Take 50 mg by mouth daily. Take with or immediately following a meal.    . Multiple Vitamin (MULTIVITAMIN) capsule Take 1 capsule by mouth daily.    Marland Kitchen oxyCODONE-acetaminophen (PERCOCET/ROXICET) 5-325 MG tablet Take 1 tablet by mouth daily. Take 1 tablet every 4-6 hours as needed for pain     No current facility-administered medications for this visit.    Allergies:   Review of patient's allergies indicates no known allergies.    Social History:  The patient  reports that he has quit smoking. He has never used smokeless tobacco.   Family History:  The patient's family history includes Healthy in his mother, sister, and sister; Heart attack in his father; Heart disease in his father; Other in his brother.    ROS:  General:no colds or fevers, mild weight changes Skin:no rashes or ulcers HEENT:no blurred vision, no congestion CV:see HPI PUL:see HPI GI:no diarrhea  constipation or melena, no indigestion GU:no hematuria, no dysuria MS:no joint pain, no claudication Neuro:no syncope, no lightheadedness Endo:+ diabetes, no thyroid disease  Wt Readings from Last 3 Encounters:  11/13/15 301 lb 6.4 oz (136.714 kg)  05/14/15 299 lb 9.6 oz (135.898 kg)  02/22/14 297 lb (134.718 kg)     PHYSICAL EXAM: VS:  BP 136/78 mmHg  Pulse 78  Ht 6\' 1"  (1.854 m)  Wt 301 lb 6.4 oz (136.714 kg)  BMI 39.77 kg/m2  SpO2 96% , BMI Body mass index is 39.77 kg/(m^2). General:Pleasant affect, NAD Skin:Warm and dry, brisk capillary refill HEENT:normocephalic, sclera clear, mucus membranes moist Neck:supple, no JVD, no bruits  Heart:irreg irreg  without murmur, gallup, rub or click Lungs:clear without rales, rhonchi, or wheezes [id number], non tender, + BS, do not palpate liver spleen or  masses Ext:no lower ext edema, 2+ pedal pulses, 2+ radial pulses Neuro:alert and oriented X 3, MAE, follows commands, + facial symmetry    EKG:  EKG is ordered today. The ekg ordered today demonstrates a flutter with HR of 88 but at times with HR brief pauses.  No acute ST changes.    Recent Labs: No results found for requested labs within last 365 days.    Lipid Panel No results found for: CHOL, TRIG, HDL, CHOLHDL, VLDL, LDLCALC, LDLDIRECT     Other studies Reviewed: Additional studies/ records that were reviewed today include: OV notes. .   ASSESSMENT AND PLAN:  1.  Chest tightness most likely from flutter will plan lexiscan myoview post DCCV. I will review with Dr. Tamala Julian.   2.  A flutter with Bradycardia now improved continue current dose of toprol.  Discussed with Dr. Burt Knack and will plan for TEE DCCV as pt continues to be symptomatic.  He will continue eliquis. Instructed not to miss any doses.   I did stop ASA for now.  He will follow up with EP to eval, may be a candidate for ablation.    [facility] has been requested to perform a transesophageal echocardiogram on Joshua Terrell for 11/18/15.  After careful review of history and examination, the risks and benefits of transesophageal echocardiogram have been explained including risks of esophageal damage, perforation (1:10,000 risk), bleeding, pharyngeal hematoma as well as other potential complications associated with conscious sedation including aspiration, arrhythmia, respiratory failure and death. Alternatives to treatment were discussed, questions were answered. Patient is willing to proceed.   I discussed cardioversion with pt and possibility of severe bradycardia post procedure as risk and that anesthesia may cause side effect , both that could cause at least overnight stay.     3. Coronary artery disease involving coronary bypass graft of native heart without angina pectoris Chest tightness.. Known to have an  atretic LAD. Cath last performed 2014.  4. Essential hypertension, benign Excellent control to low. Currently stable.   5. Chronic diastolic heart failure (HCC) No evidence of volume overload  6. Severe obstructive sleep apnea Obese. Compliant with therapy. Current medicines are reviewed with the patient today.  The patient Has no concerns regarding medicines.  The following changes have been made:  See above Labs/ tests ordered today include:see above  Disposition:   FU:  see above  Signed, Cecilie Kicks, NP  11/13/2015 2:16 PM    [facility] [place] [place], [place] Phone: [phone]; Fax: [phone]

## 2015-11-13 ENCOUNTER — Encounter: Payer: Self-pay | Admitting: *Deleted

## 2015-11-13 ENCOUNTER — Ambulatory Visit (INDEPENDENT_AMBULATORY_CARE_PROVIDER_SITE_OTHER): Payer: PPO | Admitting: Cardiology

## 2015-11-13 ENCOUNTER — Other Ambulatory Visit: Payer: Self-pay

## 2015-11-13 ENCOUNTER — Encounter: Payer: Self-pay | Admitting: Cardiology

## 2015-11-13 VITALS — BP 136/78 | HR 78 | Ht 73.0 in | Wt 301.4 lb

## 2015-11-13 DIAGNOSIS — I4892 Unspecified atrial flutter: Secondary | ICD-10-CM | POA: Diagnosis not present

## 2015-11-13 DIAGNOSIS — I25810 Atherosclerosis of coronary artery bypass graft(s) without angina pectoris: Secondary | ICD-10-CM | POA: Diagnosis not present

## 2015-11-13 DIAGNOSIS — I10 Essential (primary) hypertension: Secondary | ICD-10-CM | POA: Diagnosis not present

## 2015-11-13 DIAGNOSIS — I5032 Chronic diastolic (congestive) heart failure: Secondary | ICD-10-CM | POA: Diagnosis not present

## 2015-11-13 DIAGNOSIS — R0789 Other chest pain: Secondary | ICD-10-CM

## 2015-11-13 LAB — CBC WITH DIFFERENTIAL/PLATELET
BASOS PCT: 1 %
Basophils Absolute: 62 cells/uL (ref 0–200)
EOS PCT: 4 %
Eosinophils Absolute: 248 cells/uL (ref 15–500)
HCT: 39.8 % (ref 38.5–50.0)
HEMOGLOBIN: 13.2 g/dL (ref 13.2–17.1)
LYMPHS ABS: 1302 {cells}/uL (ref 850–3900)
LYMPHS PCT: 21 %
MCH: 29.2 pg (ref 27.0–33.0)
MCHC: 33.2 g/dL (ref 32.0–36.0)
MCV: 88.1 fL (ref 80.0–100.0)
MPV: 10.1 fL (ref 7.5–12.5)
Monocytes Absolute: 558 cells/uL (ref 200–950)
Monocytes Relative: 9 %
NEUTROS PCT: 65 %
Neutro Abs: 4030 cells/uL (ref 1500–7800)
Platelets: 248 10*3/uL (ref 140–400)
RBC: 4.52 MIL/uL (ref 4.20–5.80)
RDW: 14.4 % (ref 11.0–15.0)
WBC: 6.2 10*3/uL (ref 3.8–10.8)

## 2015-11-13 LAB — BASIC METABOLIC PANEL
BUN: 23 mg/dL (ref 7–25)
CALCIUM: 9.2 mg/dL (ref 8.6–10.3)
CO2: 23 mmol/L (ref 20–31)
Chloride: 103 mmol/L (ref 98–110)
Creat: 1.28 mg/dL — ABNORMAL HIGH (ref 0.70–1.18)
GLUCOSE: 211 mg/dL — AB (ref 65–99)
Potassium: 5 mmol/L (ref 3.5–5.3)
SODIUM: 140 mmol/L (ref 135–146)

## 2015-11-13 LAB — PROTIME-INR
INR: 1
PROTHROMBIN TIME: 10.4 s (ref 9.0–11.5)

## 2015-11-13 NOTE — Patient Instructions (Addendum)
Medication Instructions:  Your physician has recommended you make the following change in your medication:  1.  STOP the Aspirin   Labwork: TODAY:  BMET, TSH, CBC W/DIFF, & PT/INR  Testing/Procedures: Your physician has requested that you have a TEE/Cardioversion. During a TEE, sound waves are used to create images of your heart. It provides your doctor with information about the size and shape of your heart and how well your heart's chambers and valves are working. In this test, a transducer is attached to the end of a flexible tube that is guided down you throat and into your esophagus (the tube leading from your mouth to your stomach) to get a more detailed image of your heart. Once the TEE has determined that a blood clot is not present, the cardioversion begins. Electrical Cardioversion uses a jolt of electricity to your heart either through paddles or wired patches attached to your chest. This is a controlled, usually prescheduled, procedure. This procedure is done at the hospital and you are not awake during the procedure. You usually go home the day of the procedure. Please see the instruction sheet given to you today for more information.  Your physician has requested that you have a lexiscan myoview in 2-3 WEEKS.  For further information please visit [email or web address]. Please follow instruction sheet, as given.   Follow-Up: Your physician recommends that you schedule a follow-up appointment in:  3-4 WITH 1ST AVAILABLE EP SPECIALIST FOR BRADYCARIDA/A-FLUTTER  Any Other Special Instructions Will Be Listed Below (If Applicable).  Transesophageal Echocardiogram Transesophageal echocardiography (TEE) is a special type of test that produces images of the heart by using sound waves (echocardiogram). This type of echocardiography can obtain better images of the heart than standard echocardiography. TEE is done by passing a flexible tube down the esophagus. The heart is located in front of the  esophagus. Because the heart and esophagus are close to one another, your health care provider can take very clear, detailed pictures of the heart via ultrasound waves. TEE may be done:  If your health care provider needs more information based on standard echocardiography findings.  If you had a stroke. This might have happened because a clot formed in your heart. TEE can visualize different areas of the heart and check for clots.  To check valve anatomy and function.  To check for infection on the inside of your heart (endocarditis).  To evaluate the dividing wall (septum) of the heart and presence of a hole that did not close after birth (patent foramen ovale or atrial septal defect).  To help diagnose a tear in the wall of the aorta (aortic dissection).  During cardiac valve surgery. This allows the surgeon to assess the valve repair before closing the chest.  During a variety of other cardiac procedures to guide positioning of catheters.  Sometimes before a cardioversion, which is a shock to convert heart rhythm back to normal. LET [facility] CARE PROVIDER KNOW ABOUT:   Any allergies you have.  All medicines you are taking, including vitamins, herbs, eye drops, creams, and over-the-counter medicines.  Previous problems you or members of your family have had with the use of anesthetics.  Any blood disorders you have.  Previous surgeries you have had.  Medical conditions you have.  Swallowing difficulties.  An esophageal obstruction. RISKS AND COMPLICATIONS  Generally, TEE is a safe procedure. However, as with any procedure, complications can occur. Possible complications include an esophageal tear (rupture). BEFORE THE PROCEDURE   Do not eat  or drink for 6 hours before the procedure or as directed by your health care provider.  Arrange for someone to drive you home after the procedure. Do not drive yourself home. During the procedure, you will be given medicines that  can continue to make you feel drowsy and can impair your reflexes.  An IV access tube will be started in the arm. PROCEDURE   A medicine to help you relax (sedative) will be given through the IV access tube.  A medicine may be sprayed or gargled to numb the back of the throat.  Your blood pressure, heart rate, and breathing (vital signs) will be monitored during the procedure.  The TEE probe is a long, flexible tube. The tip of the probe is placed into the back of the mouth, and you will be asked to swallow. This helps to pass the tip of the probe into the esophagus. Once the tip of the probe is in the correct area, your health care provider can take pictures of the heart.  TEE is usually not a painful procedure. You may feel the probe press against the back of the throat. The probe does not enter the trachea and does not affect your breathing. AFTER THE PROCEDURE   You will be in bed, resting, until you have fully returned to consciousness.  When you first awaken, your throat may feel slightly sore and will probably still feel numb. This will improve slowly over time.  You will not be allowed to eat or drink until it is clear that the numbness has improved.  Once you have been able to drink, urinate, and sit on the edge of the bed without feeling sick to your stomach (nausea) or dizzy, you may be cleared to go home.  You should have a friend or family member with you for the next 24 hours after your procedure.   This information is not intended to replace advice given to you by your health care provider. Make sure you discuss any questions you have with your health care provider.   Document Released: 08/08/2002 Document Revised: 05/23/2013 Document Reviewed: 11/17/2012 Elsevier Interactive Patient Education 2016 [employer].   Pharmacologic Stress Electrocardiogram A pharmacologic stress electrocardiogram is a heart (cardiac) test that uses nuclear imaging to evaluate the blood  supply to your heart. This test may also be called a pharmacologic stress electrocardiography. Pharmacologic means that a medicine is used to increase your heart rate and blood pressure.  This stress test is done to find areas of poor blood flow to the heart by determining the extent of coronary artery disease (CAD). Some people exercise on a treadmill, which naturally increases the blood flow to the heart. For those people unable to exercise on a treadmill, a medicine is used. This medicine stimulates your heart and will cause your heart to beat harder and more quickly, as if you were exercising.  Pharmacologic stress tests can help determine:  The adequacy of blood flow to your heart during increased levels of activity in order to clear you for discharge home.  The extent of coronary artery blockage caused by CAD.  Your prognosis if you have suffered a heart attack.  The effectiveness of cardiac procedures done, such as an angioplasty, which can increase the circulation in your coronary arteries.  Causes of chest pain or pressure. LET [facility] CARE PROVIDER KNOW ABOUT:  Any allergies you have.  All medicines you are taking, including vitamins, herbs, eye drops, creams, and over-the-counter medicines.  Previous problems you or members of your family have had with the use of anesthetics.  Any blood disorders you have.  Previous surgeries you have had.  Medical conditions you have.  Possibility of pregnancy, if this applies.  If you are currently breastfeeding. RISKS AND COMPLICATIONS Generally, this is a safe procedure. However, as with any procedure, complications can occur. Possible complications include:  You develop pain or pressure in the following areas:  Chest.  Jaw or neck.  Between your shoulder blades.  Radiating down your left arm.  Headache.  Dizziness or light-headedness.  Shortness of breath.  Increased or irregular heartbeat.  Low blood  pressure.  Nausea or vomiting.  Flushing.  Redness going up the arm and slight pain during injection of medicine.  Heart attack (rare). BEFORE THE PROCEDURE   Avoid all forms of caffeine for 24 hours before your test or as directed by your health care provider. This includes coffee, tea (even decaffeinated tea), caffeinated sodas, chocolate, cocoa, and certain pain medicines.  Follow your health care provider's instructions regarding eating and drinking before the test.  Take your medicines as directed at regular times with water unless instructed otherwise. Exceptions may include:  If you have diabetes, ask how you are to take your insulin or pills. It is common to adjust insulin dosing the morning of the test.  If you are taking beta-blocker medicines, it is important to talk to your health care provider about these medicines well before the date of your test. Taking beta-blocker medicines may interfere with the test. In some cases, these medicines need to be changed or stopped 24 hours or more before the test.  If you wear a nitroglycerin patch, it may need to be removed prior to the test. Ask your health care provider if the patch should be removed before the test.  If you use an inhaler for any breathing condition, bring it with you to the test.  If you are an outpatient, bring a snack so you can eat right after the stress phase of the test.  Do not smoke for 4 hours prior to the test or as directed by your health care provider.  Do not apply lotions, powders, creams, or oils on your chest prior to the test.  Wear comfortable shoes and clothing. Let your health care provider know if you were unable to complete or follow the preparations for your test. PROCEDURE   Multiple patches (electrodes) will be put on your chest. If needed, small areas of your chest may be shaved to get better contact with the electrodes. Once the electrodes are attached to your body, multiple wires will  be attached to the electrodes, and your heart rate will be monitored.  An IV access will be started. A nuclear trace (isotope) is given. The isotope may be given intravenously, or it may be swallowed. Nuclear refers to several types of radioactive isotopes, and the nuclear isotope lights up the arteries so that the nuclear images are clear. The isotope is absorbed by your body. This results in low radiation exposure.  A resting nuclear image is taken to show how your heart functions at rest.  A medicine is given through the IV access.  A second scan is done about 1 hour after the medicine injection and determines how your heart functions under stress.  During this stress phase, you will be connected to an electrocardiogram machine. Your blood pressure and oxygen levels will be monitored. AFTER THE PROCEDURE   Your  heart rate and blood pressure will be monitored after the test.  You may return to your normal schedule, including diet,activities, and medicines, unless your health care provider tells you otherwise.   This information is not intended to replace advice given to you by your health care provider. Make sure you discuss any questions you have with your health care provider.   Document Released: 10/04/2008 Document Revised: 05/23/2013 Document Reviewed: 01/23/2013 Elsevier Interactive Patient Education [employer].  If you need a refill on your cardiac medications before your next appointment, please call your pharmacy.

## 2015-11-14 LAB — TSH: TSH: 1.24 m[IU]/L (ref 0.40–4.50)

## 2015-11-15 DIAGNOSIS — Z23 Encounter for immunization: Secondary | ICD-10-CM | POA: Diagnosis not present

## 2015-11-15 DIAGNOSIS — I4891 Unspecified atrial fibrillation: Secondary | ICD-10-CM | POA: Diagnosis not present

## 2015-11-18 ENCOUNTER — Ambulatory Visit (HOSPITAL_COMMUNITY): Payer: PPO | Admitting: Certified Registered Nurse Anesthetist

## 2015-11-18 ENCOUNTER — Encounter (HOSPITAL_COMMUNITY)
Admission: RE | Disposition: A | Discharge status: Home / Self Care | Payer: Self-pay | Source: Ambulatory Visit | Attending: Internal Medicine

## 2015-11-18 ENCOUNTER — Encounter (HOSPITAL_COMMUNITY): Payer: Self-pay

## 2015-11-18 ENCOUNTER — Ambulatory Visit (HOSPITAL_COMMUNITY)
Admission: RE | Admit: 2015-11-18 | Discharge: 2015-11-18 | Disposition: A | Discharge status: Home / Self Care | Payer: PPO | Source: Ambulatory Visit | Attending: Internal Medicine | Admitting: Internal Medicine

## 2015-11-18 ENCOUNTER — Inpatient Hospital Stay (HOSPITAL_COMMUNITY): Admit: 2015-11-18 | Payer: PPO

## 2015-11-18 DIAGNOSIS — Z6839 Body mass index (BMI) 39.0-39.9, adult: Secondary | ICD-10-CM | POA: Insufficient documentation

## 2015-11-18 DIAGNOSIS — I4892 Unspecified atrial flutter: Secondary | ICD-10-CM | POA: Insufficient documentation

## 2015-11-18 DIAGNOSIS — I2510 Atherosclerotic heart disease of native coronary artery without angina pectoris: Secondary | ICD-10-CM | POA: Insufficient documentation

## 2015-11-18 DIAGNOSIS — I110 Hypertensive heart disease with heart failure: Secondary | ICD-10-CM | POA: Insufficient documentation

## 2015-11-18 DIAGNOSIS — Z955 Presence of coronary angioplasty implant and graft: Secondary | ICD-10-CM | POA: Insufficient documentation

## 2015-11-18 DIAGNOSIS — G4730 Sleep apnea, unspecified: Secondary | ICD-10-CM | POA: Diagnosis not present

## 2015-11-18 HISTORY — DX: Acute myocardial infarction, unspecified: I21.9

## 2015-11-18 SURGERY — CANCELLED PROCEDURE
Anesthesia: Monitor Anesthesia Care

## 2015-11-18 MED ORDER — SODIUM CHLORIDE 0.9 % IV SOLN: INTRAVENOUS | Status: DC

## 2015-11-18 NOTE — Progress Notes (Signed)
Pt came in today for TEE/Cardioversion. Patient was found to be in NSR, Dr. Harrington Challenger was notified and discussed cancellation with patient and when to follow-up.

## 2015-11-18 NOTE — Progress Notes (Signed)
Pt presented for TEE Cardioversion  EKG showed SR   He says he felt considerably better the past 2 days   WIll forward to Dr Tamala Julian and Serita Butcher For now since feeling good will cancel stress test. Pt has appt with EP  Keep that   BP was 162.  Wlll go home and take toprol or aldactone Has amlodippne to take if BP remains HIgh

## 2015-11-18 NOTE — Anesthesia Preprocedure Evaluation (Addendum)
Anesthesia Evaluation  Patient identified by MRN, date of birth, ID band Patient awake    Reviewed: Allergy & Precautions, H&P , NPO status , Patient's Chart, lab work & pertinent test results  History of Anesthesia Complications Negative for: history of anesthetic complications  Airway Mallampati: II  TM Distance: >3 FB Neck ROM: full    Dental no notable dental hx.    Pulmonary sleep apnea , former smoker,    Pulmonary exam normal breath sounds clear to auscultation       Cardiovascular hypertension, + CAD, + Cardiac Stents and + CABG  Normal cardiovascular exam+ dysrhythmias Atrial Fibrillation  Rhythm:regular Rate:Normal     Neuro/Psych negative neurological ROS     GI/Hepatic negative GI ROS, Neg liver ROS,   Endo/Other  diabetesMorbid obesity  Renal/GU negative Renal ROS     Musculoskeletal   Abdominal   Peds  Hematology negative hematology ROS (+)   Anesthesia Other Findings   Reproductive/Obstetrics negative OB ROS                            Anesthesia Physical Anesthesia Plan  ASA: III  Anesthesia Plan: MAC   Post-op Pain Management:    Induction: Intravenous  Airway Management Planned:   Additional Equipment:   Intra-op Plan:   Post-operative Plan:   Informed Consent: I have reviewed the patients History and Physical, chart, labs and discussed the procedure including the risks, benefits and alternatives for the proposed anesthesia with the patient or authorized representative who has indicated his/her understanding and acceptance.   Dental Advisory Given  Plan Discussed with: Anesthesiologist, CRNA and Surgeon  Anesthesia Plan Comments:         Anesthesia Quick Evaluation

## 2015-11-20 ENCOUNTER — Telehealth: Payer: Self-pay | Admitting: *Deleted

## 2015-11-20 NOTE — Telephone Encounter (Signed)
Per Cecilie Kicks, NP, I called pt to cancel his stress test.  Pt showed up for TEE/Cardioversion feeling much better.  He will f/u with Dr. Curt Bears 12/09/15. Pt agreeable with this plan and verbalized understanding.

## 2015-11-20 NOTE — Telephone Encounter (Signed)
Message From: Belva Crome, MD 11/18/2015 11:15 AM   Stay on current therapy minus aspirin. Continue Eliquis ----- Message -----  From: Fay Records, MD  Sent: 11/18/2015  8:37 AM   To: Isaiah Serge, NP, Belva Crome, MD Subject: Inpatient Notes                  I spoke with pt and told him he should stop ASA.  Pt reports when he went for cardioversion he was told to go back on all medicines except amlodipine. This had been stopped by Dr. Laurann Montana.  Plan was for pt to monitor blood pressure and see how it goes. If blood pressure stays OK he should stay off. Would plan to resume if BP above 160.  Wife reports bp has been around 122/65.  I told wife to continue to monitor blood pressure and let us know if it becomes elevated.  I told her I would send message to Dr. Tamala Julian to make him aware and we would call back if he had any additional recommendations.

## 2015-11-21 NOTE — Telephone Encounter (Signed)
Stay off aspirin

## 2015-11-22 NOTE — Telephone Encounter (Signed)
Spoke with pt and informed him that Dr. Tamala Julian said to stay off the ASA. Reminded pt about monitoring BP and calling if he sees it remaining elevated. Pt states it has been running fine lately. Pt verbalized understanding and was in agreement with this plan.

## 2015-11-22 NOTE — Telephone Encounter (Signed)
Left message to call back  

## 2015-11-22 NOTE — Telephone Encounter (Signed)
Follow-up ° ° ° ° °Pt returning the nurses phone call °

## 2015-12-02 ENCOUNTER — Ambulatory Visit (INDEPENDENT_AMBULATORY_CARE_PROVIDER_SITE_OTHER): Payer: PPO | Admitting: Internal Medicine

## 2015-12-02 ENCOUNTER — Encounter: Payer: Self-pay | Admitting: Internal Medicine

## 2015-12-02 VITALS — BP 132/62 | HR 83 | Ht 72.0 in | Wt 299.6 lb

## 2015-12-02 DIAGNOSIS — I4892 Unspecified atrial flutter: Secondary | ICD-10-CM

## 2015-12-02 NOTE — Progress Notes (Signed)
Pt left without being seen.

## 2015-12-04 ENCOUNTER — Ambulatory Visit (HOSPITAL_COMMUNITY): Payer: PPO

## 2015-12-05 ENCOUNTER — Ambulatory Visit (HOSPITAL_COMMUNITY): Payer: PPO

## 2015-12-06 ENCOUNTER — Encounter: Payer: Self-pay | Admitting: Cardiology

## 2015-12-06 ENCOUNTER — Ambulatory Visit (INDEPENDENT_AMBULATORY_CARE_PROVIDER_SITE_OTHER): Payer: PPO | Admitting: Cardiology

## 2015-12-06 VITALS — BP 122/78 | HR 87 | Ht 72.0 in | Wt 297.2 lb

## 2015-12-06 DIAGNOSIS — I4892 Unspecified atrial flutter: Secondary | ICD-10-CM | POA: Diagnosis not present

## 2015-12-06 MED ORDER — AMIODARONE HCL 200 MG PO TABS: 200.0000 mg | ORAL_TABLET | Freq: Every day | ORAL | Status: DC

## 2015-12-06 MED ORDER — AMIODARONE HCL 200 MG PO TABS: ORAL_TABLET | ORAL | Status: DC

## 2015-12-06 NOTE — Patient Instructions (Addendum)
Medication Instructions:  Your physician has recommended you make the following change in your medication:  1) START Amiodarone   - take 200 mg twice daily for 1 week, then decrease and take 200 mg once daily for routine maintanence  Labwork: None ordered  Testing/Procedures: Your physician has requested that you have an echocardiogram. Echocardiography is a painless test that uses sound waves to create images of your heart. It provides your doctor with information about the size and shape of your heart and how well your heart's chambers and valves are working. This procedure takes approximately one hour. There are no restrictions for this procedure.  Follow-Up: Your physician wants you to follow-up in: 3 months with Dr. Curt Bears. You will receive a reminder letter in the mail two months in advance. If you don't receive a letter, please call our office to schedule the follow-up appointment.  If you need a refill on your cardiac medications before your next appointment, please call your pharmacy.  Thank you for choosing [facility]!!   Trinidad Curet, RN [phone]   Any Other Special Instructions Will Be Listed Below (If Applicable).  Amiodarone tablets What is this medicine? AMIODARONE (a MEE oh da rone) is an antiarrhythmic drug. It helps make your heart beat regularly. Because of the side effects caused by this medicine, it is only used when other medicines have not worked. It is usually used for heartbeat problems that may be life threatening. This medicine may be used for other purposes; ask your health care provider or pharmacist if you have questions. What should I tell my health care provider before I take this medicine? They need to know if you have any of these conditions: -liver disease -lung disease -other heart problems -thyroid disease -an unusual or allergic reaction to amiodarone, iodine, other medicines, foods, dyes, or preservatives -pregnant or trying to get  pregnant -breast-feeding How should I use this medicine? Take this medicine by mouth with a glass of water. Follow the directions on the prescription label. You can take this medicine with or without food. However, you should always take it the same way each time. Take your doses at regular intervals. Do not take your medicine more often than directed. Do not stop taking except on the advice of your doctor or health care professional. A special MedGuide will be given to you by the pharmacist with each prescription and refill. Be sure to read this information carefully each time. Talk to your pediatrician regarding the use of this medicine in children. Special care may be needed. Overdosage: If you think you have taken too much of this medicine contact a poison control center or emergency room at once. NOTE: This medicine is only for you. Do not share this medicine with others. What if I miss a dose? If you miss a dose, take it as soon as you can. If it is almost time for your next dose, take only that dose. Do not take double or extra doses. What may interact with this medicine? Do not take this medicine with any of the following medications: -abarelix -apomorphine -arsenic trioxide -certain antibiotics like erythromycin, gemifloxacin, levofloxacin, pentamidine -certain medicines for depression like amoxapine, tricyclic antidepressants -certain medicines for fungal infections like fluconazole, itraconazole, ketoconazole, posaconazole, voriconazole -certain medicines for irregular heart beat like disopyramide, dofetilide, dronedarone, ibutilide, propafenone, sotalol -certain medicines for malaria like chloroquine, halofantrine -cisapride -droperidol -haloperidol -hawthorn -maprotiline -methadone -phenothiazines like chlorpromazine, mesoridazine, thioridazine -pimozide -ranolazine -red yeast rice -vardenafil -ziprasidone This medicine may  also interact with the following  medications: -antiviral medicines for HIV or AIDS -certain medicines for blood pressure, heart disease, irregular heart beat -certain medicines for cholesterol like atorvastatin, cerivastatin, lovastatin, simvastatin -certain medicines for hepatitis C like sofosbuvir and ledipasvir; sofosbuvir -certain medicines for seizures like phenytoin -certain medicines for thyroid problems -certain medicines that treat or prevent blood clots like warfarin -cholestyramine -cimetidine -clopidogrel -cyclosporine -dextromethorphan -diuretics -fentanyl -general anesthetics -grapefruit juice -lidocaine -loratadine -methotrexate -other medicines that prolong the QT interval (cause an abnormal heart rhythm) -procainamide -quinidine -rifabutin, rifampin, or rifapentine -St. John's Wort -trazodone This list may not describe all possible interactions. Give your health care provider a list of all the medicines, herbs, non-prescription drugs, or dietary supplements you use. Also tell them if you smoke, drink alcohol, or use illegal drugs. Some items may interact with your medicine. What should I watch for while using this medicine? Your condition will be monitored closely when you first begin therapy. Often, this drug is first started in a hospital or other monitored health care setting. Once you are on maintenance therapy, visit your doctor or health care professional for regular checks on your progress. Because your condition and use of this medicine carry some risk, it is a good idea to carry an identification card, necklace or bracelet with details of your condition, medications, and doctor or health care professional. Dennis Bast may get drowsy or dizzy. Do not drive, use machinery, or do anything that needs mental alertness until you know how this medicine affects you. Do not stand or sit up quickly, especially if you are an older patient. This reduces the risk of dizzy or fainting spells. This medicine can make  you more sensitive to the sun. Keep out of the sun. If you cannot avoid being in the sun, wear protective clothing and use sunscreen. Do not use sun lamps or tanning beds/booths. You should have regular eye exams before and during treatment. Call your doctor if you have blurred vision, see halos, or your eyes become sensitive to light. Your eyes may get dry. It may be helpful to use a lubricating eye solution or artificial tears solution. If you are going to have surgery or a procedure that requires contrast dyes, tell your doctor or health care professional that you are taking this medicine. What side effects may I notice from receiving this medicine? Side effects that you should report to your doctor or health care professional as soon as possible: -allergic reactions like skin rash, itching or hives, swelling of the face, lips, or tongue -blue-gray coloring of the skin -blurred vision, seeing blue green halos, increased sensitivity of the eyes to light -breathing problems -chest pain -dark urine -fast, irregular heartbeat -feeling faint or light-headed -intolerance to heat or cold -nausea or vomiting -pain and swelling of the scrotum -pain, tingling, numbness in feet, hands -redness, blistering, peeling or loosening of the skin, including inside the mouth -spitting up blood -stomach pain -sweating -unusual or uncontrolled movements of body -unusually weak or tired -weight gain or loss -yellowing of the eyes or skin Side effects that usually do not require medical attention (report to your doctor or health care professional if they continue or are bothersome): -change in sex drive or performance -constipation -dizziness -headache -loss of appetite -trouble sleeping This list may not describe all possible side effects. Call your doctor for medical advice about side effects. You may report side effects to FDA at 1-800-FDA-1088. Where should I keep my medicine? Keep out of the  reach  of children. Store at room temperature between 20 and 25 degrees C (68 and 77 degrees F). Protect from light. Keep container tightly closed. Throw away any unused medicine after the expiration date. NOTE: This sheet is a summary. It may not cover all possible information. If you have questions about this medicine, talk to your doctor, pharmacist, or health care provider.    2016, Elsevier/Gold Standard. (2013-08-21 19:48:11)

## 2015-12-06 NOTE — Progress Notes (Signed)
Electrophysiology Office Note   Date:  12/06/2015   ID:  Joshua Terrell, DOB 1946-05-31, MRN [id number]  PCP:  Irven Shelling, MD  Cardiologist:  Tamala Julian Primary Electrophysiologist:  Omaira Mellen Meredith Leeds, MD    Chief Complaint  Patient presents with  . Advice Only  . Atrial Flutter  . Bradycardia     History of Present Illness: Joshua Terrell is a 71 y.o. male who presents today for electrophysiology evaluation.   History of CAD with CABG 1994 including SVG to OM, SVG to RCA, and SVG to diagonal last cath 2014. LIMA to LAD is atretic. He does have sleep apnea and wears C Pap.    In June, was doing well until 6/10. Was at rifle range and with walking became dizzy and weak. No chest tightness then. He went home and BP 70 systolic. He was drinkng lots of fluids. He was seen by Dr. Laurann Montana yesterday and his amlodipine, and spironolactone were stopped. His toprol was decreased from 75 to 50 mg daily. He was started on Eliquis for CHA2DS2VASc score of 4.   Today, he denies symptoms of palpitations, chest pain, shortness of breath, orthopnea, PND, lower extremity edema, claudication, dizziness, presyncope, syncope, bleeding, or neurologic sequela. The patient is tolerating medications without difficulties and is otherwise without complaint today.  He has had no evidence of tachycardia since his episode at the end of June.   Past Medical History  Diagnosis Date  . Coronary artery disease     PTCA of the codominant LAD, 1992, CABG July 94  . Diabetes mellitus without complication ([place])   . HTN (hypertension)   . Erectile dysfunction   . Tinnitus   . Diverticulosis   . Hyperlipidemia   . Obesity   . OSA (obstructive sleep apnea)   . Myocardial infarction [facility])    Past Surgical History  Procedure Laterality Date  . Coronary angioplasty    . Coronary artery bypass graft    . Vasectomy       Current Outpatient Prescriptions  Medication Sig Dispense Refill  .  amLODipine (NORVASC) 5 MG tablet Take 5 mg by mouth daily.    Marland Kitchen apixaban (ELIQUIS) 5 MG TABS tablet Take 5 mg by mouth 2 (two) times daily.    . Aspirin-Caffeine (BAYER BACK & BODY) 500-32.5 MG TABS Take 1 tablet by mouth every 6 (six) hours as needed (pain).    Marland Kitchen atorvastatin (LIPITOR) 80 MG tablet Take 80 mg by mouth daily.     . empagliflozin (JARDIANCE) 10 MG TABS tablet Take 10 mg by mouth daily.    . Exenatide ER (BYDUREON) 2 MG PEN Inject 2 mg into the skin every Sunday.     Marland Kitchen FREESTYLE LITE test strip     . insulin NPH Human (HUMULIN N,NOVOLIN N) 100 UNIT/ML injection Inject 75-85 Units into the skin 2 (two) times daily before a meal. Inject 85 units subcutaneously daily with breakfast and 75 units with supper    . lisinopril-hydrochlorothiazide (PRINZIDE,ZESTORETIC) 20-12.5 MG tablet Take 1 tablet by mouth daily.    . metFORMIN (GLUCOPHAGE) 1000 MG tablet Take 500 mg by mouth 2 (two) times daily. Morning and at bedtime    . metoprolol succinate (TOPROL-XL) 50 MG 24 hr tablet Take 50 mg by mouth daily. Take with or immediately following a meal.    . Multiple Vitamin (MULTIVITAMIN WITH MINERALS) TABS tablet Take 1 tablet by mouth daily.    . nitroGLYCERIN (NITROSTAT) 0.4 MG SL tablet Place  0.4 mg under the tongue every 5 (five) minutes as needed for chest pain.    Marland Kitchen oxyCODONE-acetaminophen (PERCOCET/ROXICET) 5-325 MG tablet Take 1 tablet by mouth at bedtime as needed (pain).     Marland Kitchen spironolactone (ALDACTONE) 25 MG tablet Take 25 mg by mouth daily.    Marland Kitchen amiodarone (PACERONE) 200 MG tablet Take 1 tablet (200 mg total) by mouth twice a day for one week. 14 tablet 0  . amiodarone (PACERONE) 200 MG tablet Take 1 tablet (200 mg total) by mouth daily. 30 tablet 6   No current facility-administered medications for this visit.    Allergies:   Review of patient's allergies indicates no known allergies.   Social History:  The patient  reports that he has quit smoking. He has never used smokeless  tobacco.   Family History:  The patient's family history includes Healthy in his mother, sister, and sister; Heart attack in his father; Heart disease in his father; Other in his brother.    ROS:  Please see the history of present illness.   Otherwise, review of systems is positive for none.   All other systems are reviewed and negative.    PHYSICAL EXAM: VS:  BP 122/78 mmHg  Pulse 87  Ht 6' (1.829 m)  Wt 297 lb 3.2 oz (134.809 kg)  BMI 40.30 kg/m2 , BMI Body mass index is 40.3 kg/(m^2). GEN: Well nourished, well developed, in no acute distress HEENT: normal Neck: no JVD, carotid bruits, or masses Cardiac: RRR; no murmurs, rubs, or gallops,no edema  Respiratory:  clear to auscultation bilaterally, normal work of breathing GI: soft, nontender, nondistended, + BS MS: no deformity or atrophy Skin: warm and dry Neuro:  Strength and sensation are intact Psych: euthymic mood, full affect  EKG:  EKG is ordered today. The ekg ordered today shows sinus rhythm, 1 degree AV block PR 240, rate 87  Recent Labs: 11/13/2015: BUN 23; Creat 1.28*; Hemoglobin 13.2; Platelets 248; Potassium 5.0; Sodium 140; TSH 1.24    Lipid Panel  No results found for: CHOL, TRIG, HDL, CHOLHDL, VLDL, LDLCALC, LDLDIRECT   Wt Readings from Last 3 Encounters:  12/06/15 297 lb 3.2 oz (134.809 kg)  12/02/15 299 lb 9.6 oz (135.898 kg)  11/18/15 301 lb (136.533 kg)      Other studies Reviewed: Additional studies/ records that were reviewed today include: Epic notes   ASSESSMENT AND PLAN:  1.  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  Was initially planned for TEE/CV but went back into sinus rhythm.  Currently he is in sinus rhythm today. Unfortunately his atrial flutter appears to be atypical with positive F waves in the inferior leads and positive in V1. Due to that, we'll attempt medical management for his atrial flutter. We Bernd Crom start him on amiodarone today. He is also not had an echocardiogram, and  we Titan Karner order one today to determine his atrial size. We Emonni Depasquale see him back in 3 months to further discuss options which might include ablation.  This patients CHA2DS2-VASc Score and unadjusted Ischemic Stroke Rate (% per year) is equal to 4.8 % stroke rate/year from a score of 4  Above score calculated as 1 point each if present [CHF, HTN, DM, Vascular=MI/PAD/Aortic Plaque, Age if 65-74, or Male] Above score calculated as 2 points each if present [Age > 75, or Stroke/TIA/TE]   Current medicines are reviewed at length with the patient today.   The patient does not have concerns regarding his medicines.  The following changes  were made today:  amiodarone  Labs/ tests ordered today include:  Orders Placed This Encounter  Procedures  . EKG 12-Lead  . ECHOCARDIOGRAM COMPLETE     Disposition:   FU with Maui Ahart 3 months  Signed, Jenin Birdsall Meredith Leeds, MD  12/06/2015 1:16 PM     [address] [place] [address] [phone] (office) [phone] (fax)

## 2015-12-09 ENCOUNTER — Institutional Professional Consult (permissible substitution): Payer: PPO | Admitting: Cardiology

## 2015-12-12 DIAGNOSIS — G4733 Obstructive sleep apnea (adult) (pediatric): Secondary | ICD-10-CM | POA: Diagnosis not present

## 2015-12-13 ENCOUNTER — Other Ambulatory Visit: Payer: Self-pay | Admitting: Cardiology

## 2015-12-13 MED ORDER — AMIODARONE HCL 200 MG PO TABS: 200.0000 mg | ORAL_TABLET | Freq: Every day | ORAL | Status: DC

## 2015-12-24 ENCOUNTER — Other Ambulatory Visit (HOSPITAL_COMMUNITY): Payer: Self-pay

## 2015-12-24 ENCOUNTER — Ambulatory Visit (HOSPITAL_COMMUNITY): Payer: PPO | Attending: Cardiology

## 2015-12-24 DIAGNOSIS — Z6841 Body Mass Index (BMI) 40.0 and over, adult: Secondary | ICD-10-CM | POA: Diagnosis not present

## 2015-12-24 DIAGNOSIS — I4892 Unspecified atrial flutter: Secondary | ICD-10-CM | POA: Diagnosis not present

## 2015-12-24 DIAGNOSIS — G4733 Obstructive sleep apnea (adult) (pediatric): Secondary | ICD-10-CM | POA: Insufficient documentation

## 2015-12-24 DIAGNOSIS — Z951 Presence of aortocoronary bypass graft: Secondary | ICD-10-CM | POA: Diagnosis not present

## 2015-12-24 DIAGNOSIS — I358 Other nonrheumatic aortic valve disorders: Secondary | ICD-10-CM | POA: Diagnosis not present

## 2015-12-24 DIAGNOSIS — I059 Rheumatic mitral valve disease, unspecified: Secondary | ICD-10-CM | POA: Insufficient documentation

## 2015-12-24 DIAGNOSIS — E785 Hyperlipidemia, unspecified: Secondary | ICD-10-CM | POA: Diagnosis not present

## 2015-12-24 DIAGNOSIS — I517 Cardiomegaly: Secondary | ICD-10-CM | POA: Diagnosis not present

## 2015-12-24 DIAGNOSIS — I2510 Atherosclerotic heart disease of native coronary artery without angina pectoris: Secondary | ICD-10-CM | POA: Insufficient documentation

## 2015-12-24 LAB — ECHOCARDIOGRAM COMPLETE
HEIGHTINCHES: 72 in
Weight: 4752 oz

## 2015-12-24 MED ORDER — PERFLUTREN LIPID MICROSPHERE
1.0000 mL | INTRAVENOUS | Status: AC | PRN
Start: 2015-12-24 — End: 2015-12-24
  Administered 2015-12-24: 2 mL via INTRAVENOUS

## 2016-01-17 ENCOUNTER — Encounter: Payer: Self-pay | Admitting: Cardiology

## 2016-01-20 ENCOUNTER — Encounter (INDEPENDENT_AMBULATORY_CARE_PROVIDER_SITE_OTHER): Payer: Self-pay

## 2016-01-20 ENCOUNTER — Encounter: Payer: Self-pay | Admitting: Cardiology

## 2016-01-20 ENCOUNTER — Ambulatory Visit (INDEPENDENT_AMBULATORY_CARE_PROVIDER_SITE_OTHER): Payer: PPO | Admitting: Cardiology

## 2016-01-20 VITALS — BP 128/72 | HR 76 | Ht 72.0 in | Wt 297.6 lb

## 2016-01-20 DIAGNOSIS — I484 Atypical atrial flutter: Secondary | ICD-10-CM | POA: Diagnosis not present

## 2016-01-20 DIAGNOSIS — I4892 Unspecified atrial flutter: Secondary | ICD-10-CM

## 2016-01-20 NOTE — Patient Instructions (Signed)
Medication Instructions:  Your physician recommends that you continue on your current medications as directed. Please refer to the Current Medication list given to you today.  If you need a refill on your cardiac medications before your next appointment, please call your pharmacy.   Labwork: None ordered  Testing/Procedures: None ordered  Follow-Up: Your physician wants you to follow-up in: 6 months with Dr. Camnitz.  You will receive a reminder letter in the mail two months in advance. If you don't receive a letter, please call our office to schedule the follow-up appointment.  Thank you for choosing [facility]!!   Champagne Paletta, RN [phone]         

## 2016-01-20 NOTE — Progress Notes (Signed)
Electrophysiology Office Note   Date:  01/20/2016   ID:  Joshua Terrell, DOB [birth year], MRN [id number]  PCP:  Irven Shelling, MD  Cardiologist:  Tamala Julian Primary Electrophysiologist:  Jordie Schreur Meredith Leeds, MD    Chief Complaint  Patient presents with  . Follow-up    Aflutter     History of Present Illness: Joshua Terrell is a 70 y.o. male who presents today for electrophysiology evaluation.   History of CAD with CABG 1994 including SVG to OM, SVG to RCA, and SVG to diagonal last cath 2014. LIMA to LAD is atretic. He does have sleep apnea and wears C Pap.  Since last being seen, he is feeling much better. He has much fewer episodes of palpitations, only having one short episode every few days. He is having some shortness of breath and some dizziness on standing. Otherwise he is doing well.  Today, he denies symptoms of chest pain, shortness of breath, orthopnea, PND, lower extremity edema, claudication, dizziness, presyncope, syncope, bleeding, or neurologic sequela. The patient is tolerating medications without difficulties and is without complaint today.     Past Medical History:  Diagnosis Date  . Coronary artery disease    PTCA of the codominant LAD, 1992, CABG July 94  . Diabetes mellitus without complication ([place])   . Diverticulosis   . Erectile dysfunction   . HTN (hypertension)   . Hyperlipidemia   . Myocardial infarction ([place])   . Obesity   . OSA (obstructive sleep apnea)   . Tinnitus    Past Surgical History:  Procedure Laterality Date  . CORONARY ANGIOPLASTY    . CORONARY ARTERY BYPASS GRAFT    . VASECTOMY       Current Outpatient Prescriptions  Medication Sig Dispense Refill  . amiodarone (PACERONE) 200 MG tablet Take 1 tablet (200 mg total) by mouth daily. 30 tablet 6  . apixaban (ELIQUIS) 5 MG TABS tablet Take 5 mg by mouth 2 (two) times daily.    Marland Kitchen atorvastatin (LIPITOR) 80 MG tablet Take 80 mg by mouth daily.     . empagliflozin (JARDIANCE) 10 MG  TABS tablet Take 10 mg by mouth daily.    . Exenatide ER (BYDUREON) 2 MG PEN Inject 2 mg into the skin every Sunday.     Marland Kitchen FREESTYLE LITE test strip     . insulin NPH Human (HUMULIN N,NOVOLIN N) 100 UNIT/ML injection Inject 75-85 Units into the skin 2 (two) times daily before a meal. Inject 85 units subcutaneously daily with breakfast and 75 units with supper    . lisinopril-hydrochlorothiazide (PRINZIDE,ZESTORETIC) 20-12.5 MG tablet Take 1 tablet by mouth daily.    . metFORMIN (GLUCOPHAGE) 1000 MG tablet Take 500 mg by mouth 2 (two) times daily. Morning and at bedtime    . metoprolol succinate (TOPROL-XL) 50 MG 24 hr tablet Take 50 mg by mouth daily. Take with or immediately following a meal.    . Multiple Vitamin (MULTIVITAMIN WITH MINERALS) TABS tablet Take 1 tablet by mouth daily.    . nitroGLYCERIN (NITROSTAT) 0.4 MG SL tablet Place 0.4 mg under the tongue every 5 (five) minutes as needed for chest pain.    Marland Kitchen oxyCODONE-acetaminophen (PERCOCET/ROXICET) 5-325 MG tablet Take 1 tablet by mouth at bedtime as needed (pain).     Marland Kitchen spironolactone (ALDACTONE) 25 MG tablet Take 25 mg by mouth daily.     No current facility-administered medications for this visit.     Allergies:   Review of patient's allergies indicates  no known allergies.   Social History:  The patient  reports that he has quit smoking. He has never used smokeless tobacco.   Family History:  The patient's family history includes Healthy in his mother, sister, and sister; Heart attack in his father; Heart disease in his father; Other in his brother.    ROS:  Please see the history of present illness.   Otherwise, review of systems is positive for fatigue, palpitations, back pain, dizziness.   All other systems are reviewed and negative.    PHYSICAL EXAM: VS:  BP 128/72   Pulse 76   Ht 6' (1.829 m)   Wt 297 lb 9.6 oz (135 kg)   BMI 40.36 kg/m  , BMI Body mass index is 40.36 kg/m. GEN: Well nourished, well developed, in no  acute distress  HEENT: normal  Neck: no JVD, carotid bruits, or masses Cardiac: RRR; no murmurs, rubs, or gallops,no edema  Respiratory:  clear to auscultation bilaterally, normal work of breathing GI: soft, nontender, nondistended, + BS MS: no deformity or atrophy  Skin: warm and dry Neuro:  Strength and sensation are intact Psych: euthymic mood, full affect  EKG:  EKG is ordered today. The ekg ordered today shows sinus rhythm, 1 degree AV block PR 256, rate 76  Recent Labs: 11/13/2015: BUN 23; Creat 1.28; Hemoglobin 13.2; Platelets 248; Potassium 5.0; Sodium 140; TSH 1.24    Lipid Panel  No results found for: CHOL, TRIG, HDL, CHOLHDL, VLDL, LDLCALC, LDLDIRECT   Wt Readings from Last 3 Encounters:  01/20/16 297 lb 9.6 oz (135 kg)  12/24/15 297 lb (134.7 kg)  12/06/15 297 lb 3.2 oz (134.8 kg)      Other studies Reviewed: Additional studies/ records that were reviewed today include: TTE 12/24/15 - Left ventricle: The cavity size was normal. Systolic function was   vigorous. The estimated ejection fraction was in the range of 65%   to 70%. Wall motion was normal; there were no regional wall   motion abnormalities. Doppler parameters are consistent with   abnormal left ventricular relaxation (grade 1 diastolic   dysfunction). - Aortic valve: Trileaflet; mildly thickened, mildly calcified   leaflets. - Mitral valve: Calcified annulus. - Right ventricle: The cavity size was mildly dilated. Wall   thickness was normal. -Left atrium:  The atrium was normal in size.  ASSESSMENT AND PLAN:  1.  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  Was initially planned for TEE/CV but went back into sinus rhythm.  Currently he is in sinus rhythm today. Unfortunately his atrial flutter appears to be atypical with positive F waves in the inferior leads and positive in V1. Started him on amiodarone at last visit.  He is doing well on the amiodarone without major complaint. He is unfortunately  a fall onto the donut hole in regards to his anticoagulation. He Anthon Harpole call us back and we Lakeyia Surber potentially switch him to Coumadin depending on his out-of-pocket costs.  This patients CHA2DS2-VASc Score and unadjusted Ischemic Stroke Rate (% per year) is equal to 4.8 % stroke rate/year from a score of 4  Above score calculated as 1 point each if present [CHF, HTN, DM, Vascular=MI/PAD/Aortic Plaque, Age if 65-74, or Male] Above score calculated as 2 points each if present [Age > 75, or Stroke/TIA/TE]  2. HTN: well controlled today  3. CAD: no current chest pain  Current medicines are reviewed at length with the patient today.   The patient does not have concerns regarding his medicines.  The following changes were made today:  none immediately labs checked in the next visit for any other  Labs/ tests ordered today include:  No orders of the defined types were placed in this encounter.    Disposition:   FU with Corrin Hingle 6 months  Signed, Daisean Brodhead Meredith Leeds, MD  01/20/2016 1:59 PM     [facility] [address] [place] [address] [phone] (office) [phone] (fax) left leg is instructed no changes as

## 2016-02-17 DIAGNOSIS — Z7984 Long term (current) use of oral hypoglycemic drugs: Secondary | ICD-10-CM | POA: Diagnosis not present

## 2016-02-17 DIAGNOSIS — E119 Type 2 diabetes mellitus without complications: Secondary | ICD-10-CM | POA: Diagnosis not present

## 2016-02-17 DIAGNOSIS — I480 Paroxysmal atrial fibrillation: Secondary | ICD-10-CM | POA: Diagnosis not present

## 2016-02-17 DIAGNOSIS — I10 Essential (primary) hypertension: Secondary | ICD-10-CM | POA: Diagnosis not present

## 2016-02-26 DIAGNOSIS — Z23 Encounter for immunization: Secondary | ICD-10-CM | POA: Diagnosis not present

## 2016-05-02 ENCOUNTER — Other Ambulatory Visit: Payer: Self-pay | Admitting: Interventional Cardiology

## 2016-06-02 ENCOUNTER — Other Ambulatory Visit: Payer: Self-pay | Admitting: Interventional Cardiology

## 2016-07-03 DIAGNOSIS — E119 Type 2 diabetes mellitus without complications: Secondary | ICD-10-CM | POA: Diagnosis not present

## 2016-07-03 DIAGNOSIS — E1165 Type 2 diabetes mellitus with hyperglycemia: Secondary | ICD-10-CM | POA: Diagnosis not present

## 2016-07-03 DIAGNOSIS — G4733 Obstructive sleep apnea (adult) (pediatric): Secondary | ICD-10-CM | POA: Diagnosis not present

## 2016-07-03 DIAGNOSIS — I480 Paroxysmal atrial fibrillation: Secondary | ICD-10-CM | POA: Diagnosis not present

## 2016-07-03 DIAGNOSIS — Z794 Long term (current) use of insulin: Secondary | ICD-10-CM | POA: Diagnosis not present

## 2016-07-03 DIAGNOSIS — I10 Essential (primary) hypertension: Secondary | ICD-10-CM | POA: Diagnosis not present

## 2016-07-10 DIAGNOSIS — E119 Type 2 diabetes mellitus without complications: Secondary | ICD-10-CM | POA: Diagnosis not present

## 2016-07-10 DIAGNOSIS — H2513 Age-related nuclear cataract, bilateral: Secondary | ICD-10-CM | POA: Diagnosis not present

## 2016-07-21 ENCOUNTER — Ambulatory Visit (INDEPENDENT_AMBULATORY_CARE_PROVIDER_SITE_OTHER): Payer: PPO | Admitting: Cardiology

## 2016-07-21 ENCOUNTER — Encounter: Payer: Self-pay | Admitting: Cardiology

## 2016-07-21 VITALS — BP 134/72 | HR 72 | Ht 72.0 in | Wt 294.2 lb

## 2016-07-21 DIAGNOSIS — I484 Atypical atrial flutter: Secondary | ICD-10-CM | POA: Diagnosis not present

## 2016-07-21 DIAGNOSIS — Z79899 Other long term (current) drug therapy: Secondary | ICD-10-CM

## 2016-07-21 LAB — HEPATIC FUNCTION PANEL
ALK PHOS: 59 IU/L (ref 39–117)
ALT: 27 IU/L (ref 0–44)
AST: 23 IU/L (ref 0–40)
Albumin: 4.7 g/dL (ref 3.5–4.8)
BILIRUBIN, DIRECT: 0.12 mg/dL (ref 0.00–0.40)
Bilirubin Total: 0.4 mg/dL (ref 0.0–1.2)
Total Protein: 7.6 g/dL (ref 6.0–8.5)

## 2016-07-21 LAB — TSH: TSH: 0.78 u[IU]/mL (ref 0.450–4.500)

## 2016-07-21 NOTE — Progress Notes (Signed)
Electrophysiology Office Note   Date:  07/21/2016   ID:  Joshua Terrell, Joshua Terrell 1945-06-29, MRN [id number]  PCP:  Irven Shelling, MD  Cardiologist:  Tamala Julian Primary Electrophysiologist:  Anapaola Kinsel Meredith Leeds, MD    Chief Complaint  Patient presents with  . Follow-up    ATypical AFlutter     History of Present Illness: Joshua Terrell is a 71 y.o. male who presents today for electrophysiology evaluation.   History of CAD with CABG 1994 including SVG to OM, SVG to RCA, and SVG to diagonal last cath 2014. LIMA to LAD is atretic. He does have sleep apnea and wears C Pap.  Since last being seen, he is feeling much better. He has much fewer episodes of palpitations, only having one short episode every few days. He is having some shortness of breath and some dizziness on standing. Otherwise he is doing well.  Today, he denies symptoms of chest pain, shortness of breath, orthopnea, PND, lower extremity edema, claudication, dizziness, presyncope, syncope, bleeding, or neurologic sequela. The patient is tolerating medications without difficulties and is without complaint today.     Past Medical History:  Diagnosis Date  . Coronary artery disease    PTCA of the codominant LAD, 1992, CABG July 94  . Diabetes mellitus without complication ([place])   . Diverticulosis   . Erectile dysfunction   . HTN (hypertension)   . Hyperlipidemia   . Myocardial infarction   . Obesity   . OSA (obstructive sleep apnea)   . Tinnitus    Past Surgical History:  Procedure Laterality Date  . CORONARY ANGIOPLASTY    . CORONARY ARTERY BYPASS GRAFT    . VASECTOMY       Current Outpatient Prescriptions  Medication Sig Dispense Refill  . amiodarone (PACERONE) 200 MG tablet Take 1 tablet (200 mg total) by mouth daily. 30 tablet 6  . apixaban (ELIQUIS) 5 MG TABS tablet Take 5 mg by mouth 2 (two) times daily.    Marland Kitchen atorvastatin (LIPITOR) 80 MG tablet Take 80 mg by mouth daily.     . empagliflozin (JARDIANCE) 10  MG TABS tablet Take 10 mg by mouth daily.    . Exenatide ER (BYDUREON) 2 MG PEN Inject 2 mg into the skin every Sunday.     . insulin NPH Human (HUMULIN N,NOVOLIN N) 100 UNIT/ML injection Inject 75-85 Units into the skin 2 (two) times daily before a meal. Inject 85 units subcutaneously daily with breakfast and 75 units with supper    . lisinopril-hydrochlorothiazide (PRINZIDE,ZESTORETIC) 20-12.5 MG tablet Take 1 tablet by mouth daily.    . metFORMIN (GLUCOPHAGE) 1000 MG tablet Take 500 mg by mouth 2 (two) times daily. Morning and at bedtime    . metoprolol succinate (TOPROL-XL) 50 MG 24 hr tablet Take 50 mg by mouth daily. Take with or immediately following a meal.    . Multiple Vitamin (MULTIVITAMIN WITH MINERALS) TABS tablet Take 1 tablet by mouth daily.    . nitroGLYCERIN (NITROSTAT) 0.4 MG SL tablet Place 0.4 mg under the tongue every 5 (five) minutes as needed for chest pain.    Marland Kitchen oxyCODONE-acetaminophen (PERCOCET/ROXICET) 5-325 MG tablet Take 1 tablet by mouth at bedtime as needed (pain).     Marland Kitchen spironolactone (ALDACTONE) 25 MG tablet TAKE 1 TABLET BY MOUTH DAILY 90 tablet 2   No current facility-administered medications for this visit.     Allergies:   Patient has no known allergies.   Social History:  The patient  reports that he has quit smoking. He has never used smokeless tobacco.   Family History:  The patient's family history includes Healthy in his mother, sister, and sister; Heart attack in his father; Heart disease in his father; Other in his brother.    ROS:  Please see the history of present illness.   Otherwise, review of systems is positive for none.   All other systems are reviewed and negative.    PHYSICAL EXAM: VS:  BP 134/72   Pulse 72   Ht 6' (1.829 m)   Wt 294 lb 3.2 oz (133.4 kg)   BMI 39.90 kg/m  , BMI Body mass index is 39.9 kg/m. GEN: Well nourished, well developed, in no acute distress  HEENT: normal  Neck: no JVD, carotid bruits, or masses Cardiac:  RRR; no murmurs, rubs, or gallops,no edema  Respiratory:  clear to auscultation bilaterally, normal work of breathing GI: soft, nontender, nondistended, + BS MS: no deformity or atrophy  Skin: warm and dry Neuro:  Strength and sensation are intact Psych: euthymic mood, full affect  EKG:  EKG is ordered today. Personal review of the ekg ordered today shows ectopic atrial rhythm, rate 72  Recent Labs: 11/13/2015: BUN 23; Creat 1.28; Hemoglobin 13.2; Platelets 248; Potassium 5.0; Sodium 140; TSH 1.24    Lipid Panel  No results found for: CHOL, TRIG, HDL, CHOLHDL, VLDL, LDLCALC, LDLDIRECT   Wt Readings from Last 3 Encounters:  07/21/16 294 lb 3.2 oz (133.4 kg)  01/20/16 297 lb 9.6 oz (135 kg)  12/24/15 297 lb (134.7 kg)      Other studies Reviewed: Additional studies/ records that were reviewed today include: TTE 12/24/15 - Left ventricle: The cavity size was normal. Systolic function was   vigorous. The estimated ejection fraction was in the range of 65%   to 70%. Wall motion was normal; there were no regional wall   motion abnormalities. Doppler parameters are consistent with   abnormal left ventricular relaxation (grade 1 diastolic   dysfunction). - Aortic valve: Trileaflet; mildly thickened, mildly calcified   leaflets. - Mitral valve: Calcified annulus. - Right ventricle: The cavity size was mildly dilated. Wall   thickness was normal. -Left atrium:  The atrium was normal in size.  ASSESSMENT AND PLAN:  1.  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 He is tolerating his medications well. He is remained in sinus rhythm since starting his amiodarone. We'll thus continue these medications. We Cereniti Curb draw a TSH and LFTs today.  This patients CHA2DS2-VASc Score and unadjusted Ischemic Stroke Rate (% per year) is equal to 4.8 % stroke rate/year from a score of 4  Above score calculated as 1 point each if present [CHF, HTN, DM, Vascular=MI/PAD/Aortic Plaque,  Age if 65-74, or Male] Above score calculated as 2 points each if present [Age > 75, or Stroke/TIA/TE]  2. HTN: well controlled today  3. CAD: no current chest pain  Current medicines are reviewed at length with the patient today.   The patient does not have concerns regarding his medicines.  The following changes were made today:  none immediately labs checked in the next visit for any other  Labs/ tests ordered today include:  Orders Placed This Encounter  Procedures  . Hepatic function panel  . TSH  . EKG 12-Lead     Disposition:   FU with Makail Watling 6 months  Signed, Annamary Buschman Meredith Leeds, MD  07/21/2016 11:19 AM     [facility] [address] [place] [address] [phone] (office) [phone] (fax) left leg is instructed no changes as

## 2016-07-21 NOTE — Patient Instructions (Addendum)
Medication Instructions:  Your physician recommends that you continue on your current medications as directed. Please refer to the Current Medication list given to you today.   Labwork: TODAY: TSH / Hepatic Function panel   Testing/Procedures: None Ordered   Follow-Up: Your physician wants you to follow-up in: 6 months with Dr. Curt Bears. You will receive a reminder letter in the mail two months in advance. If you don't receive a letter, please call our office to schedule the follow-up appointment.   Any Other Special Instructions Will Be Listed Below (If Applicable).     If you need a refill on your cardiac medications before your next appointment, please call your pharmacy.

## 2016-11-05 DIAGNOSIS — E119 Type 2 diabetes mellitus without complications: Secondary | ICD-10-CM | POA: Diagnosis not present

## 2016-11-05 DIAGNOSIS — I480 Paroxysmal atrial fibrillation: Secondary | ICD-10-CM | POA: Diagnosis not present

## 2016-11-05 DIAGNOSIS — R21 Rash and other nonspecific skin eruption: Secondary | ICD-10-CM | POA: Diagnosis not present

## 2016-11-05 DIAGNOSIS — Z6841 Body Mass Index (BMI) 40.0 and over, adult: Secondary | ICD-10-CM | POA: Diagnosis not present

## 2016-11-05 DIAGNOSIS — I10 Essential (primary) hypertension: Secondary | ICD-10-CM | POA: Diagnosis not present

## 2016-11-05 DIAGNOSIS — Z0000 Encounter for general adult medical examination without abnormal findings: Secondary | ICD-10-CM | POA: Diagnosis not present

## 2016-11-05 DIAGNOSIS — G4733 Obstructive sleep apnea (adult) (pediatric): Secondary | ICD-10-CM | POA: Diagnosis not present

## 2016-11-05 DIAGNOSIS — E7800 Pure hypercholesterolemia, unspecified: Secondary | ICD-10-CM | POA: Diagnosis not present

## 2016-11-05 DIAGNOSIS — Z1389 Encounter for screening for other disorder: Secondary | ICD-10-CM | POA: Diagnosis not present

## 2016-11-10 DIAGNOSIS — G4733 Obstructive sleep apnea (adult) (pediatric): Secondary | ICD-10-CM | POA: Diagnosis not present

## 2016-12-10 DIAGNOSIS — G4733 Obstructive sleep apnea (adult) (pediatric): Secondary | ICD-10-CM | POA: Diagnosis not present

## 2017-01-04 ENCOUNTER — Other Ambulatory Visit: Payer: Self-pay | Admitting: Interventional Cardiology

## 2017-01-08 ENCOUNTER — Other Ambulatory Visit: Payer: Self-pay | Admitting: Cardiology

## 2017-01-08 ENCOUNTER — Telehealth: Payer: Self-pay | Admitting: Cardiology

## 2017-01-08 NOTE — Telephone Encounter (Signed)
New message        *STAT* If patient is at the pharmacy, call can be transferred to refill team.   1. Which medications need to be refilled? (please list name of each medication and dose if known) amiodarone 200mg   2. Which pharmacy/location (including street and city if local pharmacy) is medication to be sent to? [facility] 3. Do they need a 30 day or 90 day supply? 30 day supply.  Pt has an appt on 02-03-17 with Dr Curt Bears

## 2017-01-10 DIAGNOSIS — G4733 Obstructive sleep apnea (adult) (pediatric): Secondary | ICD-10-CM | POA: Diagnosis not present

## 2017-01-13 ENCOUNTER — Encounter: Payer: Self-pay | Admitting: Cardiology

## 2017-01-22 ENCOUNTER — Other Ambulatory Visit: Payer: Self-pay | Admitting: Interventional Cardiology

## 2017-01-29 DIAGNOSIS — I10 Essential (primary) hypertension: Secondary | ICD-10-CM | POA: Diagnosis not present

## 2017-01-29 DIAGNOSIS — I4891 Unspecified atrial fibrillation: Secondary | ICD-10-CM | POA: Diagnosis not present

## 2017-01-29 DIAGNOSIS — M169 Osteoarthritis of hip, unspecified: Secondary | ICD-10-CM | POA: Diagnosis not present

## 2017-01-29 DIAGNOSIS — I480 Paroxysmal atrial fibrillation: Secondary | ICD-10-CM | POA: Diagnosis not present

## 2017-01-29 DIAGNOSIS — I2510 Atherosclerotic heart disease of native coronary artery without angina pectoris: Secondary | ICD-10-CM | POA: Diagnosis not present

## 2017-01-29 DIAGNOSIS — E1165 Type 2 diabetes mellitus with hyperglycemia: Secondary | ICD-10-CM | POA: Diagnosis not present

## 2017-02-03 ENCOUNTER — Encounter (INDEPENDENT_AMBULATORY_CARE_PROVIDER_SITE_OTHER): Payer: Self-pay

## 2017-02-03 ENCOUNTER — Encounter: Payer: Self-pay | Admitting: Cardiology

## 2017-02-03 ENCOUNTER — Ambulatory Visit (INDEPENDENT_AMBULATORY_CARE_PROVIDER_SITE_OTHER): Payer: PPO | Admitting: Cardiology

## 2017-02-03 VITALS — BP 102/62 | HR 66 | Ht 72.0 in | Wt 288.6 lb

## 2017-02-03 DIAGNOSIS — I25118 Atherosclerotic heart disease of native coronary artery with other forms of angina pectoris: Secondary | ICD-10-CM

## 2017-02-03 DIAGNOSIS — G4733 Obstructive sleep apnea (adult) (pediatric): Secondary | ICD-10-CM

## 2017-02-03 DIAGNOSIS — I484 Atypical atrial flutter: Secondary | ICD-10-CM

## 2017-02-03 DIAGNOSIS — I10 Essential (primary) hypertension: Secondary | ICD-10-CM

## 2017-02-03 MED ORDER — DILTIAZEM HCL ER COATED BEADS 180 MG PO CP24: 180.0000 mg | ORAL_CAPSULE | Freq: Every day | ORAL | 3 refills | Status: DC

## 2017-02-03 NOTE — Progress Notes (Signed)
Electrophysiology Office Note   Date:  02/03/2017   ID:  Joshua Terrell, DOB 26-Aug-1945, MRN [id number]  PCP:  [name], MD  Cardiologist:  Tamala Julian Primary Electrophysiologist:  Will Meredith Leeds, MD    Chief Complaint  Patient presents with  . Follow-up    ATypical AFlutter     History of Present Illness: Joshua Terrell is a 71 y.o. male who presents today for electrophysiology evaluation.   History of CAD with CABG 1994 including SVG to OM, SVG to RCA, and SVG to diagonal last cath 2014. LIMA to LAD is atretic. He does have sleep apnea and wears C Pap. He is continued to have dizziness when he stands. His blood pressure medications are being adjusted by his primary physician. He is also having fatigue. He does remain in rhythm, though it is an ectopic atrial rhythm.  Today, denies symptoms of palpitations, chest pain, shortness of breath, orthopnea, PND, lower extremity edema, claudication, dizziness, presyncope, syncope, bleeding, or neurologic sequela. The patient is tolerating medications without difficulties.     Past Medical History:  Diagnosis Date  . Coronary artery disease    PTCA of the codominant LAD, 1992, CABG July 94  . Diabetes mellitus without complication ([place])   . Diverticulosis   . Erectile dysfunction   . HTN (hypertension)   . Hyperlipidemia   . Myocardial infarction ([place])   . Obesity   . OSA (obstructive sleep apnea)   . Tinnitus    Past Surgical History:  Procedure Laterality Date  . CORONARY ANGIOPLASTY    . CORONARY ARTERY BYPASS GRAFT    . VASECTOMY       Current Outpatient Prescriptions  Medication Sig Dispense Refill  . amiodarone (PACERONE) 200 MG tablet TAKE 1 TABLET BY MOUTH ONCE DAILY 30 tablet 5  . apixaban (ELIQUIS) 5 MG TABS tablet Take 5 mg by mouth 2 (two) times daily.    Marland Kitchen atorvastatin (LIPITOR) 80 MG tablet Take 80 mg by mouth daily.     . empagliflozin (JARDIANCE) 10 MG TABS tablet Take 10 mg by mouth daily.    .  Exenatide ER (BYDUREON) 2 MG PEN Inject 2 mg into the skin every Sunday.     . insulin NPH Human (HUMULIN N,NOVOLIN N) 100 UNIT/ML injection Inject 175-180 Units into the skin 2 (two) times daily before a meal. Inject 85 units subcutaneously daily with breakfast and 75 units with supper     . lisinopril-hydrochlorothiazide (PRINZIDE,ZESTORETIC) 20-12.5 MG tablet Take 1 tablet by mouth daily.    . metFORMIN (GLUCOPHAGE-XR) 500 MG 24 hr tablet Take 2 tablets (1000 mg) by mouth twice daily    . Multiple Vitamin (MULTIVITAMIN WITH MINERALS) TABS tablet Take 1 tablet by mouth daily.    . nitroGLYCERIN (NITROSTAT) 0.4 MG SL tablet Place 0.4 mg under the tongue every 5 (five) minutes as needed for chest pain.    Marland Kitchen oxyCODONE-acetaminophen (PERCOCET/ROXICET) 5-325 MG tablet Take 1 tablet by mouth at bedtime as needed (pain).     Marland Kitchen spironolactone (ALDACTONE) 25 MG tablet TAKE 1 TABLET BY MOUTH DAILY 90 tablet 1  . diltiazem (CARDIZEM CD) 180 MG 24 hr capsule Take 1 capsule (180 mg total) by mouth daily. 90 capsule 3   No current facility-administered medications for this visit.     Allergies:   Patient has no known allergies.   Social History:  The patient  reports that he has quit smoking. He has never used smokeless tobacco. He reports that  he does not drink alcohol or use drugs.   Family History:  The patient's family history includes Healthy in his mother, sister, and sister; Heart attack in his father; Heart disease in his father; Other in his brother.    ROS:  Please see the history of present illness.   Otherwise, review of systems is positive for Appetite change, fatigue, dizziness.   All other systems are reviewed and negative.   PHYSICAL EXAM: VS:  BP 102/62   Pulse 66   Ht 6' (1.829 m)   Wt 288 lb 9.6 oz (130.9 kg)   BMI 39.14 kg/m  , BMI Body mass index is 39.14 kg/m. GEN: Well nourished, well developed, in no acute distress  HEENT: normal  Neck: no JVD, carotid bruits, or  masses Cardiac: RRR; no murmurs, rubs, or gallops,no edema  Respiratory:  clear to auscultation bilaterally, normal work of breathing GI: soft, nontender, nondistended, + BS MS: no deformity or atrophy  Skin: warm and dry Neuro:  Strength and sensation are intact Psych: euthymic mood, full affect  EKG:  EKG is ordered today. Personal review of the ekg ordered shows ectopic atrial rhythm, rate 66, first-degree AV block   Recent Labs: 07/21/2016: ALT 27; TSH 0.780    Lipid Panel  No results found for: CHOL, TRIG, HDL, CHOLHDL, VLDL, LDLCALC, LDLDIRECT   Wt Readings from Last 3 Encounters:  02/03/17 288 lb 9.6 oz (130.9 kg)  07/21/16 294 lb 3.2 oz (133.4 kg)  01/20/16 297 lb 9.6 oz (135 kg)      Other studies Reviewed: Additional studies/ records that were reviewed today include: TTE 12/24/15 - Left ventricle: The cavity size was normal. Systolic function was   vigorous. The estimated ejection fraction was in the range of 65%   to 70%. Wall motion was normal; there were no regional wall   motion abnormalities. Doppler parameters are consistent with   abnormal left ventricular relaxation (grade 1 diastolic   dysfunction). - Aortic valve: Trileaflet; mildly thickened, mildly calcified   leaflets. - Mitral valve: Calcified annulus. - Right ventricle: The cavity size was mildly dilated. Wall   thickness was normal. -Left atrium:  The atrium was normal in size.  ASSESSMENT AND PLAN:  1.  Atypical atrial flutter: On eliquis and amiodarone. Tolerating medications without issue. In sinus rhythm today. Is having fatigue. We'll plan to stop her metoprolol and start 180 mg of diltiazem.  This patients CHA2DS2-VASc Score and unadjusted Ischemic Stroke Rate (% per year) is equal to 4.8 % stroke rate/year from a score of 4  Above score calculated as 1 point each if present [CHF, HTN, DM, Vascular=MI/PAD/Aortic Plaque, Age if 65-74, or Male] Above score calculated as 2 points each if  present [Age > 75, or Stroke/TIA/TE]  2. HTN: Well-controlled today. Adjusted by primary physician   3. CAD:  no current chest pain   4. Obstructive sleep apnea: Compliant with CPAP  Current medicines are reviewed at length with the patient today.   The patient does not have concerns regarding his medicines.  The following changes were made today: stop metoprolol start diltiazem  Labs/ tests ordered today include:  Orders Placed This Encounter  Procedures  . EKG 12-Lead     Disposition:   FU with Will Camnitz 6 months  Signed, Will Meredith Leeds, MD  02/03/2017 10:43 AM     [facility] [address] [place] [address] [phone] (office) [phone] (fax)

## 2017-02-03 NOTE — Patient Instructions (Signed)
Medication Instructions:  Your physician has recommended you make the following change in your medication:  1. STOP Toprol 2. START Diltiazem 180 mg once daily  If you need a refill on your cardiac medications before your next appointment, please call your pharmacy.   Labwork: None ordered  Testing/Procedures: None ordered  Follow-Up: Your physician wants you to follow-up in: 6 months with Dr. Curt Bears.  You will receive a reminder letter in the mail two months in advance. If you don't receive a letter, please call our office to schedule the follow-up appointment.  Thank you for choosing [facility]!!   Trinidad Curet, RN [phone]  Any Other Special Instructions Will Be Listed Below (If Applicable).  Diltiazem extended-release capsules or tablets What is this medicine? DILTIAZEM (dil TYE a zem) is a calcium-channel blocker. It affects the amount of calcium found in your heart and muscle cells. This relaxes your blood vessels, which can reduce the amount of work the heart has to do. This medicine is used to treat high blood pressure and chest pain caused by angina. This medicine may be used for other purposes; ask your health care provider or pharmacist if you have questions. COMMON BRAND NAME(S): Cardizem CD, Cardizem LA, Cardizem SR, Cartia XT, Dilacor XR, Dilt-CD, Diltia XT, Diltzac, Matzim LA, Rema Fendt, Tiamate, Tiazac What should I tell my health care provider before I take this medicine? They need to know if you have any of these conditions: -heart problems, low blood pressure, irregular heartbeat -liver disease -previous heart attack -an unusual or allergic reaction to diltiazem, other medicines, foods, dyes, or preservatives -pregnant or trying to get pregnant -breast-feeding How should I use this medicine? Take this medicine by mouth with a glass of water. Follow the directions on the prescription label. Swallow whole, do not crush or chew. Ask your doctor or  pharmacist if your should take this medicine with food. Take your doses at regular intervals. Do not take your medicine more often then directed. Do not stop taking except on the advice of your doctor or health care professional. Ask your doctor or health care professional how to gradually reduce the dose. Talk to your pediatrician regarding the use of this medicine in children. Special care may be needed. Overdosage: If you think you have taken too much of this medicine contact a poison control center or emergency room at once. NOTE: This medicine is only for you. Do not share this medicine with others. What if I miss a dose? If you miss a dose, take it as soon as you can. If it is almost time for your next dose, take only that dose. Do not take double or extra doses. What may interact with this medicine? Do not take this medicine with any of the following medications: -cisapride -hawthorn -pimozide -ranolazine -red yeast rice This medicine may also interact with the following medications: -buspirone -carbamazepine -cimetidine -cyclosporine -digoxin -local anesthetics or general anesthetics -lovastatin -medicines for anxiety or difficulty sleeping like midazolam and triazolam -medicines for high blood pressure or heart problems -quinidine -rifampin, rifabutin, or rifapentine This list may not describe all possible interactions. Give your health care provider a list of all the medicines, herbs, non-prescription drugs, or dietary supplements you use. Also tell them if you smoke, drink alcohol, or use illegal drugs. Some items may interact with your medicine. What should I watch for while using this medicine? Check your blood pressure and pulse rate regularly. Ask your doctor or health care professional what your  blood pressure and pulse rate should be and when you should contact him or her. You may feel dizzy or lightheaded. Do not drive, use machinery, or do anything that needs mental  alertness until you know how this medicine affects you. To reduce the risk of dizzy or fainting spells, do not sit or stand up quickly, especially if you are an older patient. Alcohol can make you more dizzy or increase flushing and rapid heartbeats. Avoid alcoholic drinks. What side effects may I notice from receiving this medicine? Side effects that you should report to your doctor or health care professional as soon as possible: -allergic reactions like skin rash, itching or hives, swelling of the face, lips, or tongue -confusion, mental depression -feeling faint or lightheaded, falls -redness, blistering, peeling or loosening of the skin, including inside the mouth -slow, irregular heartbeat -swelling of the feet and ankles -unusual bleeding or bruising, pinpoint red spots on the skin Side effects that usually do not require medical attention (report to your doctor or health care professional if they continue or are bothersome): -constipation or diarrhea -difficulty sleeping -facial flushing -headache -nausea, vomiting -sexual dysfunction -weak or tired This list may not describe all possible side effects. Call your doctor for medical advice about side effects. You may report side effects to FDA at 1-800-FDA-1088. Where should I keep my medicine? Keep out of the reach of children. Store at room temperature between 15 and 30 degrees C (59 and 86 degrees F). Protect from humidity. Throw away any unused medicine after the expiration date. NOTE: This sheet is a summary. It may not cover all possible information. If you have questions about this medicine, talk to your doctor, pharmacist, or health care provider.  2018 Elsevier/Gold Standard (2007-09-08 14:35:47)

## 2017-02-10 DIAGNOSIS — G4733 Obstructive sleep apnea (adult) (pediatric): Secondary | ICD-10-CM | POA: Diagnosis not present

## 2017-02-25 DIAGNOSIS — Z23 Encounter for immunization: Secondary | ICD-10-CM | POA: Diagnosis not present

## 2017-03-10 ENCOUNTER — Other Ambulatory Visit: Payer: Self-pay | Admitting: Internal Medicine

## 2017-03-10 ENCOUNTER — Ambulatory Visit
Admission: RE | Admit: 2017-03-10 | Discharge: 2017-03-10 | Disposition: A | Discharge status: Home / Self Care | Payer: PPO | Source: Ambulatory Visit | Attending: Internal Medicine | Admitting: Internal Medicine

## 2017-03-10 DIAGNOSIS — M79671 Pain in right foot: Secondary | ICD-10-CM | POA: Diagnosis not present

## 2017-03-10 DIAGNOSIS — S99921A Unspecified injury of right foot, initial encounter: Secondary | ICD-10-CM | POA: Diagnosis not present

## 2017-03-10 DIAGNOSIS — I10 Essential (primary) hypertension: Secondary | ICD-10-CM | POA: Diagnosis not present

## 2017-03-10 DIAGNOSIS — E1165 Type 2 diabetes mellitus with hyperglycemia: Secondary | ICD-10-CM | POA: Diagnosis not present

## 2017-03-10 DIAGNOSIS — E119 Type 2 diabetes mellitus without complications: Secondary | ICD-10-CM | POA: Diagnosis not present

## 2017-03-10 DIAGNOSIS — Z794 Long term (current) use of insulin: Secondary | ICD-10-CM | POA: Diagnosis not present

## 2017-03-12 DIAGNOSIS — G4733 Obstructive sleep apnea (adult) (pediatric): Secondary | ICD-10-CM | POA: Diagnosis not present

## 2017-03-18 ENCOUNTER — Ambulatory Visit (INDEPENDENT_AMBULATORY_CARE_PROVIDER_SITE_OTHER): Payer: PPO | Admitting: Orthopedic Surgery

## 2017-03-18 ENCOUNTER — Ambulatory Visit (INDEPENDENT_AMBULATORY_CARE_PROVIDER_SITE_OTHER): Payer: PPO

## 2017-03-18 DIAGNOSIS — S92502A Displaced unspecified fracture of left lesser toe(s), initial encounter for closed fracture: Secondary | ICD-10-CM | POA: Diagnosis not present

## 2017-03-18 DIAGNOSIS — M79675 Pain in left toe(s): Secondary | ICD-10-CM | POA: Diagnosis not present

## 2017-03-18 NOTE — Progress Notes (Signed)
Office Visit Note   Patient: Joshua Terrell           Date of Birth: [birth year]           MRN: [id number] Visit Date: 03/18/2017              Requested by: [name], MD [facility] [employer] [place] [address], [place] [address] PCP: [name], MD  Chief Complaint  Patient presents with  . Right 4th Toe - Fracture      HPI: The patient is a 71 year old gentleman who presents today for evaluation of pain to his right fourth toe states it had stopped his toe about 3 weeks ago has had continued pain. He has tried a postop shoe which did not feel provided much relief he is cut the toe out of his tennis shoe he will have less pressure on the tips of his toes states this feels better than the postop shoe. He is concerned why he still has pain 3 weeks out from a toe fracture.  Concerned that this will never heal, that he'll have pain forever or have to have an amputation.  Assessment & Plan: Visit Diagnoses:  1. Closed fracture of phalanx of left fourth toe, initial encounter   2. Toe pain, left     Plan: reassurance provided.  Recommended a stiff soled walking shoe or resume his postop shoe. If continued pain in 3 weeks follow up.  Follow-Up Instructions: Return in about 3 weeks (around 04/08/2017), or if symptoms worsen or fail to improve.   Ortho Exam  Patient is alert, oriented, no adenopathy, well-dressed, normal affect, normal respiratory effort. Left foot is plantigrade. No erythema or ecchymosis to 4th toe. Tender mildly, pain with flexion of toe.   Imaging: No results found. No images are attached to the encounter.  Labs: No results found for: HGBA1C, ESRSEDRATE, CRP, LABURIC, REPTSTATUS, GRAMSTAIN, CULT, LABORGA  Orders:  Orders Placed This Encounter  Procedures  . XR Toe 4th Left   No orders of the defined types were placed in this encounter.    Procedures: No procedures performed  Clinical Data: No additional findings.  ROS:  All other systems  negative, except as noted in the HPI. Review of Systems  Constitutional: Negative for chills and fever.  Musculoskeletal: Positive for arthralgias. Negative for joint swelling.    Objective: Vital Signs: There were no vitals taken for this visit.  Specialty Comments:  No specialty comments available.  PMFS History: Patient Active Problem List   Diagnosis Date Noted  . Severe obstructive sleep apnea 02/22/2014  . Type II or unspecified type diabetes mellitus without mention of complication, uncontrolled 03/20/2013    Class: Chronic  . Essential hypertension, benign 03/20/2013    Class: Chronic  . Morbid obesity ([place]) 03/20/2013    Class: Chronic  . Chronic diastolic heart failure (HCC) 03/20/2013    Class: Chronic  . Angina pectoris ([place]) 02/09/2013    Class: Chronic  . CAD (coronary artery disease) of artery bypass graft 02/09/2013   Past Medical History:  Diagnosis Date  . Coronary artery disease    PTCA of the codominant LAD, 1992, CABG July 94  . Diabetes mellitus without complication ([place])   . Diverticulosis   . Erectile dysfunction   . HTN (hypertension)   . Hyperlipidemia   . Myocardial infarction ([place])   . Obesity   . OSA (obstructive sleep apnea)   . Tinnitus     Family History  Problem Relation  Age of Onset  . Healthy Mother   . Heart attack Father   . Heart disease Father   . Healthy Sister   . Other Brother        [place]  . Healthy Sister     Past Surgical History:  Procedure Laterality Date  . CORONARY ANGIOPLASTY    . CORONARY ARTERY BYPASS GRAFT    . VASECTOMY     Social History   Occupational History  . Not on file.   Social History Main Topics  . Smoking status: Former Research scientist (life sciences)  . Smokeless tobacco: Never Used  . Alcohol use No  . Drug use: No  . Sexual activity: Not on file

## 2017-04-12 DIAGNOSIS — G4733 Obstructive sleep apnea (adult) (pediatric): Secondary | ICD-10-CM | POA: Diagnosis not present

## 2017-05-12 DIAGNOSIS — G4733 Obstructive sleep apnea (adult) (pediatric): Secondary | ICD-10-CM | POA: Diagnosis not present

## 2017-06-04 DIAGNOSIS — T1512XA Foreign body in conjunctival sac, left eye, initial encounter: Secondary | ICD-10-CM | POA: Diagnosis not present

## 2017-06-04 DIAGNOSIS — H43812 Vitreous degeneration, left eye: Secondary | ICD-10-CM | POA: Diagnosis not present

## 2017-06-04 DIAGNOSIS — E119 Type 2 diabetes mellitus without complications: Secondary | ICD-10-CM | POA: Diagnosis not present

## 2017-06-12 DIAGNOSIS — G4733 Obstructive sleep apnea (adult) (pediatric): Secondary | ICD-10-CM | POA: Diagnosis not present

## 2017-07-05 ENCOUNTER — Other Ambulatory Visit: Payer: Self-pay | Admitting: Cardiology

## 2017-07-05 DIAGNOSIS — G894 Chronic pain syndrome: Secondary | ICD-10-CM | POA: Diagnosis not present

## 2017-07-05 DIAGNOSIS — M5136 Other intervertebral disc degeneration, lumbar region: Secondary | ICD-10-CM | POA: Diagnosis not present

## 2017-07-05 DIAGNOSIS — E119 Type 2 diabetes mellitus without complications: Secondary | ICD-10-CM | POA: Diagnosis not present

## 2017-07-05 DIAGNOSIS — I10 Essential (primary) hypertension: Secondary | ICD-10-CM | POA: Diagnosis not present

## 2017-07-13 DIAGNOSIS — G4733 Obstructive sleep apnea (adult) (pediatric): Secondary | ICD-10-CM | POA: Diagnosis not present

## 2017-08-03 ENCOUNTER — Other Ambulatory Visit: Payer: Self-pay | Admitting: Interventional Cardiology

## 2017-08-04 ENCOUNTER — Ambulatory Visit: Payer: PPO | Admitting: Cardiology

## 2017-08-04 ENCOUNTER — Encounter (INDEPENDENT_AMBULATORY_CARE_PROVIDER_SITE_OTHER): Payer: Self-pay

## 2017-08-04 ENCOUNTER — Encounter: Payer: Self-pay | Admitting: Cardiology

## 2017-08-04 VITALS — BP 110/68 | HR 59 | Ht 72.0 in | Wt 266.2 lb

## 2017-08-04 DIAGNOSIS — I10 Essential (primary) hypertension: Secondary | ICD-10-CM

## 2017-08-04 DIAGNOSIS — G4733 Obstructive sleep apnea (adult) (pediatric): Secondary | ICD-10-CM

## 2017-08-04 DIAGNOSIS — Z79899 Other long term (current) drug therapy: Secondary | ICD-10-CM | POA: Diagnosis not present

## 2017-08-04 DIAGNOSIS — I2510 Atherosclerotic heart disease of native coronary artery without angina pectoris: Secondary | ICD-10-CM

## 2017-08-04 DIAGNOSIS — I484 Atypical atrial flutter: Secondary | ICD-10-CM | POA: Diagnosis not present

## 2017-08-04 LAB — TSH: TSH: 0.871 u[IU]/mL (ref 0.450–4.500)

## 2017-08-04 LAB — COMPREHENSIVE METABOLIC PANEL
A/G RATIO: 1.6 (ref 1.2–2.2)
ALK PHOS: 63 IU/L (ref 39–117)
ALT: 35 IU/L (ref 0–44)
AST: 24 IU/L (ref 0–40)
Albumin: 4.9 g/dL — ABNORMAL HIGH (ref 3.5–4.8)
BUN/Creatinine Ratio: 14 (ref 10–24)
BUN: 19 mg/dL (ref 8–27)
Bilirubin Total: 0.5 mg/dL (ref 0.0–1.2)
CALCIUM: 10.1 mg/dL (ref 8.6–10.2)
CO2: 20 mmol/L (ref 20–29)
Chloride: 97 mmol/L (ref 96–106)
Creatinine, Ser: 1.35 mg/dL — ABNORMAL HIGH (ref 0.76–1.27)
GFR calc Af Amer: 60 mL/min/{1.73_m2} (ref >59)
GFR, EST NON AFRICAN AMERICAN: 52 mL/min/{1.73_m2} — AB (ref >59)
GLOBULIN, TOTAL: 3.1 g/dL (ref 1.5–4.5)
Glucose: 161 mg/dL — ABNORMAL HIGH (ref 65–99)
POTASSIUM: 5.1 mmol/L (ref 3.5–5.2)
SODIUM: 135 mmol/L (ref 134–144)
Total Protein: 8 g/dL (ref 6.0–8.5)

## 2017-08-04 NOTE — Progress Notes (Signed)
Electrophysiology Office Note   Date:  08/04/2017   ID:  Joshua Terrell, DOB 1945/12/19, MRN [id number]  PCP:  [name], MD  Cardiologist:  Tamala Julian Primary Electrophysiologist:  Taylan Mayhan Meredith Leeds, MD    Chief Complaint  Patient presents with  . Follow-up    A Typical AFlutter     History of Present Illness: Joshua Terrell is a 72 y.o. male who presents today for electrophysiology evaluation.   History of CAD with CABG 1994 including SVG to OM, SVG to RCA, and SVG to diagonal last cath 2014. LIMA to LAD is atretic. He does have sleep apnea and wears C Pap. He is continued to have dizziness when he stands. His blood pressure medications are being adjusted by his primary physician. He is also having fatigue. He does remain in rhythm, though it is an ectopic atrial rhythm.  Today, denies symptoms of palpitations, chest pain, shortness of breath, orthopnea, PND, lower extremity edema, claudication, dizziness, presyncope, syncope, bleeding, or neurologic sequela. The patient is tolerating medications without difficulties.  He is currently doing well without complaint.  He is noted no further episodes of atrial fibrillation or atrial flutter.   Past Medical History:  Diagnosis Date  . Coronary artery disease    PTCA of the codominant LAD, 1992, CABG July 94  . Diabetes mellitus without complication ([place])   . Diverticulosis   . Erectile dysfunction   . HTN (hypertension)   . Hyperlipidemia   . Myocardial infarction ([place])   . Obesity   . OSA (obstructive sleep apnea)   . Tinnitus    Past Surgical History:  Procedure Laterality Date  . CORONARY ANGIOPLASTY    . CORONARY ARTERY BYPASS GRAFT    . VASECTOMY       Current Outpatient Medications  Medication Sig Dispense Refill  . amiodarone (PACERONE) 200 MG tablet TAKE 1 TABLET BY MOUTH DAILY 30 tablet 7  . apixaban (ELIQUIS) 5 MG TABS tablet Take 5 mg by mouth 2 (two) times daily.    Marland Kitchen atorvastatin (LIPITOR) 80 MG tablet  Take 80 mg by mouth daily.     Marland Kitchen diltiazem (CARDIZEM CD) 180 MG 24 hr capsule Take 1 capsule (180 mg total) by mouth daily. 90 capsule 3  . empagliflozin (JARDIANCE) 10 MG TABS tablet Take 10 mg by mouth daily.    . Exenatide ER (BYDUREON) 2 MG PEN Inject 2 mg into the skin every Sunday.     . insulin NPH Human (HUMULIN N,NOVOLIN N) 100 UNIT/ML injection Inject 175-180 Units into the skin 2 (two) times daily before a meal. Inject 85 units subcutaneously daily with breakfast and 75 units with supper     . lisinopril-hydrochlorothiazide (PRINZIDE,ZESTORETIC) 20-12.5 MG tablet Take 1 tablet by mouth daily.    . metFORMIN (GLUCOPHAGE-XR) 500 MG 24 hr tablet Take 2 tablets (1000 mg) by mouth twice daily    . Multiple Vitamin (MULTIVITAMIN WITH MINERALS) TABS tablet Take 1 tablet by mouth daily.    . nitroGLYCERIN (NITROSTAT) 0.4 MG SL tablet Place 0.4 mg under the tongue every 5 (five) minutes as needed for chest pain.    Marland Kitchen oxyCODONE-acetaminophen (PERCOCET/ROXICET) 5-325 MG tablet Take 1 tablet by mouth at bedtime as needed (pain).     Marland Kitchen spironolactone (ALDACTONE) 25 MG tablet TAKE 1 TABLET BY MOUTH DAILY 90 tablet 1   No current facility-administered medications for this visit.     Allergies:   Patient has no known allergies.   Social History:  The patient  reports that he has quit smoking. he has never used smokeless tobacco. He reports that he does not drink alcohol or use drugs.   Family History:  The patient's family history includes Healthy in his mother, sister, and sister; Heart attack in his father; Heart disease in his father; Other in his brother.   ROS:  Please see the history of present illness.   Otherwise, review of systems is positive for none.   All other systems are reviewed and negative.   PHYSICAL EXAM: VS:  BP 110/68   Pulse (!) 59   Ht 6' (1.829 m)   Wt 266 lb 3.2 oz (120.7 kg)   BMI 36.10 kg/m  , BMI Body mass index is 36.1 kg/m. GEN: Well nourished, well developed,  in no acute distress  HEENT: normal  Neck: no JVD, carotid bruits, or masses Cardiac: RRR; no murmurs, rubs, or gallops,no edema  Respiratory:  clear to auscultation bilaterally, normal work of breathing GI: soft, nontender, nondistended, + BS MS: no deformity or atrophy  Skin: warm and dry Neuro:  Strength and sensation are intact Psych: euthymic mood, full affect  EKG:  EKG is ordered today. Personal review of the ekg ordered shows sinus rhythm, first-degree AV block, rate 59  Recent Labs: No results found for requested labs within last 8760 hours.    Lipid Panel  No results found for: CHOL, TRIG, HDL, CHOLHDL, VLDL, LDLCALC, LDLDIRECT   Wt Readings from Last 3 Encounters:  08/04/17 266 lb 3.2 oz (120.7 kg)  02/03/17 288 lb 9.6 oz (130.9 kg)  07/21/16 294 lb 3.2 oz (133.4 kg)      Other studies Reviewed: Additional studies/ records that were reviewed today include: TTE 12/24/15 - Left ventricle: The cavity size was normal. Systolic function was   vigorous. The estimated ejection fraction was in the range of 65%   to 70%. Wall motion was normal; there were no regional wall   motion abnormalities. Doppler parameters are consistent with   abnormal left ventricular relaxation (grade 1 diastolic   dysfunction). - Aortic valve: Trileaflet; mildly thickened, mildly calcified   leaflets. - Mitral valve: Calcified annulus. - Right ventricle: The cavity size was mildly dilated. Wall   thickness was normal. -Left atrium:  The atrium was normal in size.  ASSESSMENT AND PLAN:  1.  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  Is tolerating without issue.  In sinus rhythm today.  Has had no further episodes of atrial fibrillation or atrial flutter.  Detric Scalisi check labs for amiodarone monitoring today.  This patients CHA2DS2-VASc Score and unadjusted Ischemic Stroke Rate (% per year) is equal to 4.8 % stroke rate/year from a score of 4  Above score calculated as 1 point each  if present [CHF, HTN, DM, Vascular=MI/PAD/Aortic Plaque, Age if 65-74, or Male] Above score calculated as 2 points each if present [Age > 75, or Stroke/TIA/TE]  2. HTN: Well-controlled today.  No changes.  3. CAD: No current chest pain  4. Obstructive sleep apnea: Compliant with CPAP  Current medicines are reviewed at length with the patient today.   The patient does not have concerns regarding his medicines.  The following changes were made today: None  Labs/ tests ordered today include:  Orders Placed This Encounter  Procedures  . Comp Met (CMET)  . TSH  . EKG 12-Lead     Disposition:   FU with Chiara Coltrin 6 months  Signed, Kellina Dreese Meredith Leeds, MD  08/04/2017 11:34  AM     [place] [address] [place] [address] [phone] (office) [phone] (fax)

## 2017-08-04 NOTE — Patient Instructions (Addendum)
Medication Instructions:  Your physician recommends that you continue on your current medications as directed. Please refer to the Current Medication list given to you today.  * If you need a refill on your cardiac medications before your next appointment, please call your pharmacy. *  Labwork: Amiodarone surveillance lab work today: TSH & CMET  Testing/Procedures: None ordered  Follow-Up: Your physician recommends that you schedule a follow-up appointment with Dr. Tamala Julian.  Your physician wants you to follow-up in: 6 months with Dr. Curt Bears.  You will receive a reminder letter in the mail two months in advance. If you don't receive a letter, please call our office to schedule the follow-up appointment.  Thank you for choosing [facility]!!   Trinidad Curet, RN [phone]

## 2017-08-06 DIAGNOSIS — R1013 Epigastric pain: Secondary | ICD-10-CM | POA: Diagnosis not present

## 2017-08-10 DIAGNOSIS — G4733 Obstructive sleep apnea (adult) (pediatric): Secondary | ICD-10-CM | POA: Diagnosis not present

## 2017-08-16 DIAGNOSIS — R1013 Epigastric pain: Secondary | ICD-10-CM | POA: Diagnosis not present

## 2017-08-19 ENCOUNTER — Encounter (INDEPENDENT_AMBULATORY_CARE_PROVIDER_SITE_OTHER): Payer: Self-pay | Admitting: Orthopedic Surgery

## 2017-08-19 ENCOUNTER — Ambulatory Visit (INDEPENDENT_AMBULATORY_CARE_PROVIDER_SITE_OTHER): Payer: PPO

## 2017-08-19 ENCOUNTER — Telehealth (INDEPENDENT_AMBULATORY_CARE_PROVIDER_SITE_OTHER): Payer: Self-pay

## 2017-08-19 ENCOUNTER — Ambulatory Visit (INDEPENDENT_AMBULATORY_CARE_PROVIDER_SITE_OTHER): Payer: PPO | Admitting: Orthopedic Surgery

## 2017-08-19 VITALS — Ht 72.0 in | Wt 266.0 lb

## 2017-08-19 DIAGNOSIS — M1612 Unilateral primary osteoarthritis, left hip: Secondary | ICD-10-CM

## 2017-08-19 DIAGNOSIS — M25552 Pain in left hip: Secondary | ICD-10-CM | POA: Diagnosis not present

## 2017-08-19 MED ORDER — PREDNISONE 10 MG PO TABS: 10.0000 mg | ORAL_TABLET | Freq: Every day | ORAL | 0 refills | Status: DC

## 2017-08-19 NOTE — Addendum Note (Signed)
Addended by: Meridee Score on: 08/19/2017 04:46 PM   Modules accepted: Orders

## 2017-08-19 NOTE — Telephone Encounter (Signed)
Pt's wife called because his Prednisone has not been sent to [facility] yet.  Dr. Sharol Given has now sent the Rx.

## 2017-08-19 NOTE — Progress Notes (Signed)
Office Visit Note   Patient: Joshua Terrell           Date of Birth: 14-Jul-1945           MRN: [id number] Visit Date: 08/19/2017              Requested by: [name], MD [facility] [employer] [facility] [address] [place], [place] [address] PCP: [name], MD  Chief Complaint  Patient presents with  . Left Hip - Pain    H/o dislocation left hip approx 35-40 years ago      HPI: Patient is a 72 year old gentleman who complains of left groin and left posterior hip pain for approximately 5 years.  Patient states this starts hurting after walking about a quarter of a mile he can rest and it resolves and then after resuming walking he can just about walk three quarters of a mile.  Patient states the pain is gotten worse over the past several weeks.  Patient states he does have a history of back pain but this is different.  He states he dislocated his hip about 35-40 years ago.  He is currently on oxycodone for his back pain.  Patient is on Eliquis he has a history of a CABG and atrial fibrillation.  Assessment & Plan: Visit Diagnoses:  1. Pain in left hip   2. Unilateral primary osteoarthritis, left hip     Plan: We will start him on a low-dose prednisone 10 mg a day he will wean off as his symptoms improved.  Reevaluate in 4 weeks.  If patient is still symptomatic would have him evaluated with Dr. Ninfa Linden for total hip arthroplasty.  Follow-Up Instructions: Return in about 1 month (around 09/16/2017).   Ortho Exam  Patient is alert, oriented, no adenopathy, well-dressed, normal affect, normal respiratory effort. Examination patient does have an antalgic gait with an abductor lurch and swings his body weight over the left hip.  He has internal rotation about 0 degrees in both hips and external rotation about 30 degrees bilaterally.  Patient has a negative straight leg raise no focal motor weakness in either lower extremity.  Imaging: Xr Hip Unilat W Or W/o Pelvis 2-3 Views Left  Result  Date: 08/19/2017 2 view radiographs of the right hip shows equal joint space in both hips with increased subcondylar sclerosis on the left.  No images are attached to the encounter.  Labs: No results found for: HGBA1C, ESRSEDRATE, CRP, LABURIC, REPTSTATUS, GRAMSTAIN, CULT, LABORGA  @LABSALLVALUES (HGBA1)@  Body mass index is 36.08 kg/m.  Orders:  Orders Placed This Encounter  Procedures  . XR HIP UNILAT W OR W/O PELVIS 2-3 VIEWS LEFT   No orders of the defined types were placed in this encounter.    Procedures: No procedures performed  Clinical Data: No additional findings.  ROS:  All other systems negative, except as noted in the HPI. Review of Systems  Objective: Vital Signs: Ht 6' (1.829 m)   Wt 266 lb (120.7 kg)   BMI 36.08 kg/m   Specialty Comments:  No specialty comments available.  PMFS History: Patient Active Problem List   Diagnosis Date Noted  . Severe obstructive sleep apnea 02/22/2014  . Type II or unspecified type diabetes mellitus without mention of complication, uncontrolled 03/20/2013    Class: Chronic  . Essential hypertension, benign 03/20/2013    Class: Chronic  . Morbid obesity ([place]) 03/20/2013    Class: Chronic  . Chronic diastolic heart failure ([place]) 03/20/2013    Class:  Chronic  . Angina pectoris ([place]) 02/09/2013    Class: Chronic  . CAD (coronary artery disease) of artery bypass graft 02/09/2013   Past Medical History:  Diagnosis Date  . Coronary artery disease    PTCA of the codominant LAD, 1992, CABG July 94  . Diabetes mellitus without complication ([place])   . Diverticulosis   . Erectile dysfunction   . HTN (hypertension)   . Hyperlipidemia   . Myocardial infarction ([place])   . Obesity   . OSA (obstructive sleep apnea)   . Tinnitus     Family History  Problem Relation Age of Onset  . Healthy Mother   . Heart attack Father   . Heart disease Father   . Healthy Sister   . Other Brother        [place]  . Healthy  Sister     Past Surgical History:  Procedure Laterality Date  . CORONARY ANGIOPLASTY    . CORONARY ARTERY BYPASS GRAFT    . VASECTOMY     Social History   Occupational History  . Not on file  Tobacco Use  . Smoking status: Former Research scientist (life sciences)  . Smokeless tobacco: Never Used  Substance and Sexual Activity  . Alcohol use: No    Alcohol/week: 0.0 oz  . Drug use: No  . Sexual activity: Not on file

## 2017-08-19 NOTE — Telephone Encounter (Deleted)
Pt's wife called because the Rx for Prednisone has not been sent to [facility] yet.  Please advise.

## 2017-08-26 ENCOUNTER — Other Ambulatory Visit: Payer: Self-pay | Admitting: Internal Medicine

## 2017-08-26 DIAGNOSIS — R109 Unspecified abdominal pain: Secondary | ICD-10-CM | POA: Diagnosis not present

## 2017-08-26 DIAGNOSIS — R1013 Epigastric pain: Secondary | ICD-10-CM

## 2017-09-02 ENCOUNTER — Ambulatory Visit
Admission: RE | Admit: 2017-09-02 | Discharge: 2017-09-02 | Disposition: A | Discharge status: Home / Self Care | Payer: PPO | Source: Ambulatory Visit | Attending: Internal Medicine | Admitting: Internal Medicine

## 2017-09-02 DIAGNOSIS — R109 Unspecified abdominal pain: Secondary | ICD-10-CM

## 2017-09-02 DIAGNOSIS — R1013 Epigastric pain: Secondary | ICD-10-CM | POA: Diagnosis not present

## 2017-09-10 DIAGNOSIS — G4733 Obstructive sleep apnea (adult) (pediatric): Secondary | ICD-10-CM | POA: Diagnosis not present

## 2017-09-16 ENCOUNTER — Encounter (INDEPENDENT_AMBULATORY_CARE_PROVIDER_SITE_OTHER): Payer: Self-pay | Admitting: Orthopedic Surgery

## 2017-09-16 ENCOUNTER — Ambulatory Visit (INDEPENDENT_AMBULATORY_CARE_PROVIDER_SITE_OTHER): Payer: PPO | Admitting: Orthopedic Surgery

## 2017-09-16 DIAGNOSIS — M1612 Unilateral primary osteoarthritis, left hip: Secondary | ICD-10-CM | POA: Diagnosis not present

## 2017-09-16 NOTE — Progress Notes (Signed)
Office Visit Note   Patient: Joshua Terrell           Date of Birth: 12-27-1945           MRN: [id number] Visit Date: 09/16/2017              Requested by: [name], MD [facility] [address], [place] [address] PCP: [name], MD  Chief Complaint  Patient presents with  . Left Hip - Follow-up, Pain      HPI: Patient is a 72 year old male referring is too confusing great on this and actually on the medical potential AKA per provider will as he is gentleman who presents in follow-up for arthritis of his left hip.  Patient states that he took 10 mg with breakfast and after 48 hours he was remarkably better.  Patient took the 10 mg of prednisone for 2 weeks and it was recommended he stop the prednisone due to elevated blood pressure per the patient's report.  Patient states that his hip is better but still has some symptoms.  Assessment & Plan: Visit Diagnoses:  1. Unilateral primary osteoarthritis, left hip     Plan: Recommend patient continue with not using the prednisone.  Discussed that if his hip flares up again he could proceed with an intra-articular injection with Dr. Ernestina Patches.  He will pick up Dr. Romona Curls card and call if he wants to proceed with a intra-articular injection of his left hip.  Do not feel that patient needs to consider any type of surgery at this time.  Follow-Up Instructions: Return if symptoms worsen or fail to improve.   Ortho Exam  Patient is alert, oriented, no adenopathy, well-dressed, normal affect, normal respiratory effort. Examination patient does have a slight antalgic gait there is no pain with range of motion of the hip negative straight leg raise no focal motor weakness.  Imaging: No results found. No images are attached to the encounter.  Labs: No results found for: HGBA1C, ESRSEDRATE, CRP, LABURIC, REPTSTATUS, GRAMSTAIN, CULT, LABORGA  @LABSALLVALUES (HGBA1)@  There is no height or weight on file to calculate  BMI.  Orders:  No orders of the defined types were placed in this encounter.  No orders of the defined types were placed in this encounter.    Procedures: No procedures performed  Clinical Data: No additional findings.  ROS:  All other systems negative, except as noted in the HPI. Review of Systems  Objective: Vital Signs: There were no vitals taken for this visit.  Specialty Comments:  No specialty comments available.  PMFS History: Patient Active Problem List   Diagnosis Date Noted  . Severe obstructive sleep apnea 02/22/2014  . Type II or unspecified type diabetes mellitus without mention of complication, uncontrolled 03/20/2013    Class: Chronic  . Essential hypertension, benign 03/20/2013    Class: Chronic  . Morbid obesity ([place]) 03/20/2013    Class: Chronic  . Chronic diastolic heart failure ([facility]) 03/20/2013    Class: Chronic  . Angina pectoris ([place]) 02/09/2013    Class: Chronic  . CAD (coronary artery disease) of artery bypass graft 02/09/2013   Past Medical History:  Diagnosis Date  . Coronary artery disease    PTCA of the codominant LAD, 1992, CABG July 94  . Diabetes mellitus without complication ([place])   . Diverticulosis   . Erectile dysfunction   . HTN (hypertension)   . Hyperlipidemia   . Myocardial infarction ([place])   . Obesity   . OSA (  obstructive sleep apnea)   . Tinnitus     Family History  Problem Relation Age of Onset  . Healthy Mother   . Heart attack Father   . Heart disease Father   . Healthy Sister   . Other Brother        [place]  . Healthy Sister     Past Surgical History:  Procedure Laterality Date  . CORONARY ANGIOPLASTY    . CORONARY ARTERY BYPASS GRAFT    . VASECTOMY     Social History   Occupational History  . Not on file  Tobacco Use  . Smoking status: Former Research scientist (life sciences)  . Smokeless tobacco: Never Used  Substance and Sexual Activity  . Alcohol use: No    Alcohol/week: 0.0 oz  . Drug use: No  . Sexual  activity: Not on file

## 2017-10-10 DIAGNOSIS — G4733 Obstructive sleep apnea (adult) (pediatric): Secondary | ICD-10-CM | POA: Diagnosis not present

## 2017-11-05 ENCOUNTER — Encounter: Payer: Self-pay | Admitting: Interventional Cardiology

## 2017-11-05 ENCOUNTER — Ambulatory Visit: Payer: PPO | Admitting: Interventional Cardiology

## 2017-11-05 VITALS — BP 112/62 | HR 64 | Ht 72.0 in | Wt 263.1 lb

## 2017-11-05 DIAGNOSIS — I10 Essential (primary) hypertension: Secondary | ICD-10-CM | POA: Diagnosis not present

## 2017-11-05 DIAGNOSIS — I25708 Atherosclerosis of coronary artery bypass graft(s), unspecified, with other forms of angina pectoris: Secondary | ICD-10-CM

## 2017-11-05 DIAGNOSIS — I484 Atypical atrial flutter: Secondary | ICD-10-CM | POA: Diagnosis not present

## 2017-11-05 DIAGNOSIS — Z79899 Other long term (current) drug therapy: Secondary | ICD-10-CM

## 2017-11-05 DIAGNOSIS — I5032 Chronic diastolic (congestive) heart failure: Secondary | ICD-10-CM | POA: Diagnosis not present

## 2017-11-05 DIAGNOSIS — G4733 Obstructive sleep apnea (adult) (pediatric): Secondary | ICD-10-CM

## 2017-11-05 DIAGNOSIS — Z7901 Long term (current) use of anticoagulants: Secondary | ICD-10-CM

## 2017-11-05 NOTE — Patient Instructions (Signed)
Medication Instructions:  1) DISCONTINUE Spironolactone  Labwork: You will need a liver, lipid and TSH at your next office visit.  I recommend that you make your appointment for in the morning as you will need to be fasting for these labs (nothing to eat or drink after midnight.)  Testing/Procedures: None  Follow-Up: Your physician wants you to follow-up in: 6 months with Dr. Tamala Julian.  You will receive a reminder letter in the mail two months in advance. If you don't receive a letter, please call our office to schedule the follow-up appointment.   Any Other Special Instructions Will Be Listed Below (If Applicable).     If you need a refill on your cardiac medications before your next appointment, please call your pharmacy.

## 2017-11-05 NOTE — Progress Notes (Signed)
Cardiology Office Note    Date:  11/05/2017   ID:  Joshua Terrell, DOB Jun 05, 1945, MRN [id number]  PCP:  [name], MD  Cardiologist: [name], MD   Chief Complaint  Patient presents with  . Coronary Artery Disease  . Congestive Heart Failure    History of Present Illness:  Joshua Terrell is a 72 y.o. male who presents for CAD with CABG 1994 including SVG to OM, SVG to RCA, and SVG to diagonal. LIMA to LAD is atretic.  Over the past 6 months he has been diagnosed with atypical atrial flutter, was started on amiodarone, had spontaneous conversion, and is now on apixaban and amiodarone 200 mg daily.  He is accompanied by his wife.  He is doing well.  He has not had recurrent palpitations which were his evidence that something was not right with his heart in June 2017 when identified and flutter.  He has adjusted to being on amiodarone and apixaban with no complications.  He denies orthopnea, PND, chest pain, edema, syncope, and exertional fatigue.  Orthostatic dizziness and other random episodes of dizziness have been frequent.  Recent pharmacy/Pharm.D. appointment at [facility] raise the question of medication excess for blood pressure control.  Wife inquires if some of his medications could be discontinued.   Past Medical History:  Diagnosis Date  . Coronary artery disease    PTCA of the codominant LAD, 1992, CABG July 94  . Diabetes mellitus without complication ([place])   . Diverticulosis   . Erectile dysfunction   . HTN (hypertension)   . Hyperlipidemia   . Myocardial infarction ([place])   . Obesity   . OSA (obstructive sleep apnea)   . Tinnitus     Past Surgical History:  Procedure Laterality Date  . CORONARY ANGIOPLASTY    . CORONARY ARTERY BYPASS GRAFT    . VASECTOMY      Current Medications: Outpatient Medications Prior to Visit  Medication Sig Dispense Refill  . amiodarone (PACERONE) 200 MG tablet TAKE 1 TABLET BY MOUTH DAILY 30 tablet 7  . apixaban  (ELIQUIS) 5 MG TABS tablet Take 5 mg by mouth 2 (two) times daily.    Marland Kitchen atorvastatin (LIPITOR) 80 MG tablet Take 80 mg by mouth daily.     Marland Kitchen diltiazem (CARDIZEM CD) 180 MG 24 hr capsule Take 180 mg by mouth daily.    . empagliflozin (JARDIANCE) 10 MG TABS tablet Take 10 mg by mouth daily.    . insulin NPH Human (NOVOLIN N) 100 UNIT/ML injection Inject 40 units into the skin each morning and 45 units into the skin each evening.    Marland Kitchen lisinopril-hydrochlorothiazide (PRINZIDE,ZESTORETIC) 20-12.5 MG tablet Take 1 tablet by mouth daily.    . metFORMIN (GLUCOPHAGE-XR) 500 MG 24 hr tablet Take 2 tablets (1000 mg) by mouth twice daily    . Multiple Vitamin (MULTIVITAMIN WITH MINERALS) TABS tablet Take 1 tablet by mouth daily.    . nitroGLYCERIN (NITROSTAT) 0.4 MG SL tablet Place 0.4 mg under the tongue every 5 (five) minutes as needed for chest pain.    Marland Kitchen oxyCODONE-acetaminophen (PERCOCET/ROXICET) 5-325 MG tablet Take 1 tablet by mouth at bedtime as needed (pain).     . TRULICITY 1.5 XK/4.8JE SOPN Inject 1.5 mg into the skin every Sunday.    Marland Kitchen spironolactone (ALDACTONE) 25 MG tablet TAKE 1 TABLET BY MOUTH DAILY 90 tablet 1  . diltiazem (CARDIZEM CD) 180 MG 24 hr capsule Take 1 capsule (180 mg total) by  mouth daily. 90 capsule 3  . Exenatide ER (BYDUREON) 2 MG PEN Inject 2 mg into the skin every Sunday.     . insulin NPH Human (HUMULIN N,NOVOLIN N) 100 UNIT/ML injection Inject 175-180 Units into the skin 2 (two) times daily before a meal. Inject 85 units subcutaneously daily with breakfast and 75 units with supper     . predniSONE (DELTASONE) 10 MG tablet Take 1 tablet (10 mg total) by mouth daily with breakfast. (Patient not taking: Reported on 09/16/2017) 60 tablet 0   No facility-administered medications prior to visit.      Allergies:   Patient has no known allergies.   Social History   Socioeconomic History  . Marital status: Married    Spouse name: Not on file  . Number of children: Not on file   . Years of education: Not on file  . Highest education level: Not on file  Occupational History  . Not on file  Social Needs  . Financial resource strain: Not on file  . Food insecurity:    Worry: Not on file    Inability: Not on file  . Transportation needs:    Medical: Not on file    Non-medical: Not on file  Tobacco Use  . Smoking status: Former Research scientist (life sciences)  . Smokeless tobacco: Never Used  Substance and Sexual Activity  . Alcohol use: No    Alcohol/week: 0.0 oz  . Drug use: No  . Sexual activity: Not on file  Lifestyle  . Physical activity:    Days per week: Not on file    Minutes per session: Not on file  . Stress: Not on file  Relationships  . Social connections:    Talks on phone: Not on file    Gets together: Not on file    Attends religious service: Not on file    Active member of club or organization: Not on file    Attends meetings of clubs or organizations: Not on file    Relationship status: Not on file  Other Topics Concern  . Not on file  Social History Narrative  . Not on file     Family History:  The patient's family history includes Healthy in his mother, sister, and sister; Heart attack in his father; Heart disease in his father; Other in his brother.   ROS:   Please see the history of present illness.    Vision disturbance, back discomfort, dizziness, otherwise no complaints. All other systems reviewed and are negative.   PHYSICAL EXAM:   VS:  BP 112/62   Pulse 64   Ht 6' (1.829 m)   Wt 263 lb 1.9 oz (119.4 kg)   BMI 35.69 kg/m    GEN: Well nourished, well developed, in no acute distress  HEENT: normal  Neck: no JVD, carotid bruits, or masses Cardiac: RRR; no murmurs, rubs, or gallops,no edema  Respiratory:  clear to auscultation bilaterally, normal work of breathing GI: soft, nontender, nondistended, + BS MS: no deformity or atrophy  Skin: warm and dry, no rash Neuro:  Alert and Oriented x 3, Strength and sensation are intact Psych:  euthymic mood, full affect  Wt Readings from Last 3 Encounters:  11/05/17 263 lb 1.9 oz (119.4 kg)  08/19/17 266 lb (120.7 kg)  08/04/17 266 lb 3.2 oz (120.7 kg)      Studies/Labs Reviewed:   EKG:  EKG not repeated  Recent Labs: 08/04/2017: ALT 35; BUN 19; Creatinine, Ser 1.35; Potassium 5.1; Sodium 135;  TSH 0.871   Lipid Panel No results found for: CHOL, TRIG, HDL, CHOLHDL, VLDL, LDLCALC, LDLDIRECT  Additional studies/ records that were reviewed today include:  Reviewed the last 2 years of notes.  He has been seen by many other providers and myself.    ASSESSMENT:    1. Chronic diastolic heart failure ([place])   2. Coronary artery disease of bypass graft of native heart with stable angina pectoris ([place])   3. Essential hypertension, benign   4. Atypical atrial flutter ([place])   5. Severe obstructive sleep apnea   6. On amiodarone therapy   7. Chronic anticoagulation      PLAN:  In order of problems listed above:  1. No evidence of volume overload.  Salt restriction discussed. 2. Asymptomatic.  No aspirin because of apixaban.  Notify us of chest discomfort. 3. Blood pressure target between 229 798 mmHg systolic and diastolic 55 to 80 mmHg.  With orthostatic complaints, I will discontinue Aldactone.  Blood work will be done in 1 to 2 weeks by Dr. Laurann Montana.  Blood pressure will be reassessed at Dr. Delene Ruffini office at that time. 4. Currently controlled on amiodarone 200 mg/day.  Did not require cardioversion 5. Compliant with CPAP 6. TSH and hepatic panel have been negative so far.  TSH and hepatic panel will be repeated on return clinic visit in 6 months.  He prefers to follow with me for all cardiology issues and be referred back to Dr. Curt Bears if he needs ablation or specialized EP care 7. Monitor for bleeding.  Clinical follow-up in 6 months.    Medication Adjustments/Labs and Tests Ordered: Current medicines are reviewed at length with the patient today.  Concerns  regarding medicines are outlined above.  Medication changes, Labs and Tests ordered today are listed in the Patient Instructions below. Patient Instructions  Medication Instructions:  1) DISCONTINUE Spironolactone  Labwork: You will need a liver, lipid and TSH at your next office visit.  I recommend that you make your appointment for in the morning as you will need to be fasting for these labs (nothing to eat or drink after midnight.)  Testing/Procedures: None  Follow-Up: Your physician wants you to follow-up in: 6 months with Dr. Tamala Julian.  You will receive a reminder letter in the mail two months in advance. If you don't receive a letter, please call our office to schedule the follow-up appointment.   Any Other Special Instructions Will Be Listed Below (If Applicable).     If you need a refill on your cardiac medications before your next appointment, please call your pharmacy.      Signed, [name], MD  11/05/2017 12:19 PM    [facility] [address], [place], [place]  [address] Phone: [phone]; Fax: [phone]

## 2017-11-10 DIAGNOSIS — G4733 Obstructive sleep apnea (adult) (pediatric): Secondary | ICD-10-CM | POA: Diagnosis not present

## 2017-11-11 DIAGNOSIS — M169 Osteoarthritis of hip, unspecified: Secondary | ICD-10-CM | POA: Diagnosis not present

## 2017-11-11 DIAGNOSIS — I10 Essential (primary) hypertension: Secondary | ICD-10-CM | POA: Diagnosis not present

## 2017-11-11 DIAGNOSIS — Z0000 Encounter for general adult medical examination without abnormal findings: Secondary | ICD-10-CM | POA: Diagnosis not present

## 2017-11-11 DIAGNOSIS — Z1389 Encounter for screening for other disorder: Secondary | ICD-10-CM | POA: Diagnosis not present

## 2017-11-11 DIAGNOSIS — M5136 Other intervertebral disc degeneration, lumbar region: Secondary | ICD-10-CM | POA: Diagnosis not present

## 2017-11-11 DIAGNOSIS — I2510 Atherosclerotic heart disease of native coronary artery without angina pectoris: Secondary | ICD-10-CM | POA: Diagnosis not present

## 2017-11-11 DIAGNOSIS — G4733 Obstructive sleep apnea (adult) (pediatric): Secondary | ICD-10-CM | POA: Diagnosis not present

## 2017-11-11 DIAGNOSIS — E7800 Pure hypercholesterolemia, unspecified: Secondary | ICD-10-CM | POA: Diagnosis not present

## 2017-11-11 DIAGNOSIS — Z794 Long term (current) use of insulin: Secondary | ICD-10-CM | POA: Diagnosis not present

## 2017-11-11 DIAGNOSIS — I480 Paroxysmal atrial fibrillation: Secondary | ICD-10-CM | POA: Diagnosis not present

## 2017-11-11 DIAGNOSIS — Z6836 Body mass index (BMI) 36.0-36.9, adult: Secondary | ICD-10-CM | POA: Diagnosis not present

## 2017-11-11 DIAGNOSIS — E1169 Type 2 diabetes mellitus with other specified complication: Secondary | ICD-10-CM | POA: Diagnosis not present

## 2017-11-24 DIAGNOSIS — E1169 Type 2 diabetes mellitus with other specified complication: Secondary | ICD-10-CM | POA: Diagnosis not present

## 2017-11-24 DIAGNOSIS — I2510 Atherosclerotic heart disease of native coronary artery without angina pectoris: Secondary | ICD-10-CM | POA: Diagnosis not present

## 2017-11-24 DIAGNOSIS — Z794 Long term (current) use of insulin: Secondary | ICD-10-CM | POA: Diagnosis not present

## 2017-11-24 DIAGNOSIS — E1165 Type 2 diabetes mellitus with hyperglycemia: Secondary | ICD-10-CM | POA: Diagnosis not present

## 2017-11-24 DIAGNOSIS — I480 Paroxysmal atrial fibrillation: Secondary | ICD-10-CM | POA: Diagnosis not present

## 2017-11-24 DIAGNOSIS — M169 Osteoarthritis of hip, unspecified: Secondary | ICD-10-CM | POA: Diagnosis not present

## 2017-11-24 DIAGNOSIS — I10 Essential (primary) hypertension: Secondary | ICD-10-CM | POA: Diagnosis not present

## 2018-01-21 ENCOUNTER — Other Ambulatory Visit: Payer: Self-pay | Admitting: Cardiology

## 2018-02-09 DIAGNOSIS — G4733 Obstructive sleep apnea (adult) (pediatric): Secondary | ICD-10-CM | POA: Diagnosis not present

## 2018-03-01 ENCOUNTER — Other Ambulatory Visit: Payer: Self-pay | Admitting: Cardiology

## 2018-03-09 DIAGNOSIS — E119 Type 2 diabetes mellitus without complications: Secondary | ICD-10-CM | POA: Diagnosis not present

## 2018-03-09 DIAGNOSIS — I2510 Atherosclerotic heart disease of native coronary artery without angina pectoris: Secondary | ICD-10-CM | POA: Diagnosis not present

## 2018-03-09 DIAGNOSIS — I10 Essential (primary) hypertension: Secondary | ICD-10-CM | POA: Diagnosis not present

## 2018-03-09 DIAGNOSIS — M169 Osteoarthritis of hip, unspecified: Secondary | ICD-10-CM | POA: Diagnosis not present

## 2018-03-09 DIAGNOSIS — I480 Paroxysmal atrial fibrillation: Secondary | ICD-10-CM | POA: Diagnosis not present

## 2018-03-09 DIAGNOSIS — E1169 Type 2 diabetes mellitus with other specified complication: Secondary | ICD-10-CM | POA: Diagnosis not present

## 2018-03-14 DIAGNOSIS — E1169 Type 2 diabetes mellitus with other specified complication: Secondary | ICD-10-CM | POA: Diagnosis not present

## 2018-03-14 DIAGNOSIS — I10 Essential (primary) hypertension: Secondary | ICD-10-CM | POA: Diagnosis not present

## 2018-03-14 DIAGNOSIS — M5136 Other intervertebral disc degeneration, lumbar region: Secondary | ICD-10-CM | POA: Diagnosis not present

## 2018-03-14 DIAGNOSIS — I2510 Atherosclerotic heart disease of native coronary artery without angina pectoris: Secondary | ICD-10-CM | POA: Diagnosis not present

## 2018-03-22 DIAGNOSIS — E119 Type 2 diabetes mellitus without complications: Secondary | ICD-10-CM | POA: Diagnosis not present

## 2018-03-22 DIAGNOSIS — Z23 Encounter for immunization: Secondary | ICD-10-CM | POA: Diagnosis not present

## 2018-04-27 DIAGNOSIS — I10 Essential (primary) hypertension: Secondary | ICD-10-CM | POA: Diagnosis not present

## 2018-04-27 DIAGNOSIS — I480 Paroxysmal atrial fibrillation: Secondary | ICD-10-CM | POA: Diagnosis not present

## 2018-04-27 DIAGNOSIS — Z7984 Long term (current) use of oral hypoglycemic drugs: Secondary | ICD-10-CM | POA: Diagnosis not present

## 2018-04-27 DIAGNOSIS — Z794 Long term (current) use of insulin: Secondary | ICD-10-CM | POA: Diagnosis not present

## 2018-04-27 DIAGNOSIS — I2510 Atherosclerotic heart disease of native coronary artery without angina pectoris: Secondary | ICD-10-CM | POA: Diagnosis not present

## 2018-04-27 DIAGNOSIS — E119 Type 2 diabetes mellitus without complications: Secondary | ICD-10-CM | POA: Diagnosis not present

## 2018-04-27 DIAGNOSIS — M169 Osteoarthritis of hip, unspecified: Secondary | ICD-10-CM | POA: Diagnosis not present

## 2018-04-27 DIAGNOSIS — E1169 Type 2 diabetes mellitus with other specified complication: Secondary | ICD-10-CM | POA: Diagnosis not present

## 2018-05-19 ENCOUNTER — Ambulatory Visit: Payer: PPO | Admitting: Interventional Cardiology

## 2018-06-05 NOTE — Progress Notes (Signed)
Cardiology Office Note:    Date:  06/06/2018   ID:  Joshua Terrell, DOB [birth year], MRN [id number]  PCP:  [name], MD  Cardiologist:  No primary care provider on file.   Referring MD: [name], MD   Chief Complaint  Patient presents with  . Atrial Fibrillation  . Coronary Artery Disease    History of Present Illness:    Joshua Terrell is a 73 y.o. male with a hx of CAD with CABG 1994 including SVG to OM, SVG to RCA, and SVG to diagonal. LIMA to LAD is atretic.  Over the past 6 months he has been diagnosed with atypical atrial flutter, was started on amiodarone, had spontaneous conversion, and is now on apixaban and amiodarone 200 mg daily.  Doing well.  Since I saw him, he has been deer hunting.  He drug a 110 pound dear out of a briar patch uphill and developed extreme shortness of breath but no chest discomfort.  Shortness of breath resolved after 5 to 10 minutes.  He is not troubled by shortness of breath ordinarily.  Not currently as active as previous.  He denies angina or nitroglycerin use.  No episodes of syncope, prolonged palpitations, orthopnea, PND, edema, or syncope.   Past Medical History:  Diagnosis Date  . Coronary artery disease    PTCA of the codominant LAD, 1992, CABG July 94  . Diabetes mellitus without complication ([place])   . Diverticulosis   . Erectile dysfunction   . HTN (hypertension)   . Hyperlipidemia   . Myocardial infarction ([place])   . Obesity   . OSA (obstructive sleep apnea)   . Tinnitus     Past Surgical History:  Procedure Laterality Date  . CORONARY ANGIOPLASTY    . CORONARY ARTERY BYPASS GRAFT    . VASECTOMY      Current Medications: Current Meds  Medication Sig  . amiodarone (PACERONE) 200 MG tablet Take 1 tablet (200 mg total) by mouth daily. Please call and schedule an appointment with Dr Curt Bears for further refills 1st attempt  . apixaban (ELIQUIS) 5 MG TABS tablet Take 5 mg by mouth 2 (two) times daily.  Marland Kitchen atorvastatin  (LIPITOR) 80 MG tablet Take 80 mg by mouth daily.   Marland Kitchen diltiazem (CARDIZEM CD) 180 MG 24 hr capsule TAKE 1 CAPSULE BY MOUTH DAILY  . empagliflozin (JARDIANCE) 10 MG TABS tablet Take 10 mg by mouth daily.  . insulin NPH Human (NOVOLIN N) 100 UNIT/ML injection Inject 40 units into the skin each morning and 45 units into the skin each evening.  Marland Kitchen lisinopril-hydrochlorothiazide (PRINZIDE,ZESTORETIC) 20-12.5 MG tablet Take 1 tablet by mouth daily.  . metFORMIN (GLUCOPHAGE-XR) 500 MG 24 hr tablet Take 2 tablets (1000 mg) by mouth twice daily  . Multiple Vitamin (MULTIVITAMIN WITH MINERALS) TABS tablet Take 1 tablet by mouth daily.  . nitroGLYCERIN (NITROSTAT) 0.4 MG SL tablet Place 0.4 mg under the tongue every 5 (five) minutes as needed for chest pain.  Marland Kitchen oxyCODONE-acetaminophen (PERCOCET/ROXICET) 5-325 MG tablet Take 1 tablet by mouth at bedtime as needed (pain).   . TRULICITY 1.5 YK/9.9IP SOPN Inject 1.5 mg into the skin every Sunday.     Allergies:   Patient has no known allergies.   Social History   Socioeconomic History  . Marital status: Married    Spouse name: Not on file  . Number of children: Not on file  . Years of education: Not on file  . Highest education level: Not on  file  Occupational History  . Not on file  Social Needs  . Financial resource strain: Not on file  . Food insecurity:    Worry: Not on file    Inability: Not on file  . Transportation needs:    Medical: Not on file    Non-medical: Not on file  Tobacco Use  . Smoking status: Former Research scientist (life sciences)  . Smokeless tobacco: Never Used  Substance and Sexual Activity  . Alcohol use: No    Alcohol/week: 0.0 standard drinks  . Drug use: No  . Sexual activity: Not on file  Lifestyle  . Physical activity:    Days per week: Not on file    Minutes per session: Not on file  . Stress: Not on file  Relationships  . Social connections:    Talks on phone: Not on file    Gets together: Not on file    Attends religious  service: Not on file    Active member of club or organization: Not on file    Attends meetings of clubs or organizations: Not on file    Relationship status: Not on file  Other Topics Concern  . Not on file  Social History Narrative  . Not on file     Family History: The patient's family history includes Healthy in his mother, sister, and sister; Heart attack in his father; Heart disease in his father; Other in his brother.  ROS:   Please see the history of present illness.    Wears CPAP.  Attempts significant physical activity but mostly around hunting.  All other systems reviewed and are negative.  EKGs/Labs/Other Studies Reviewed:    The following studies were reviewed today: 2D Doppler echocardiogram July 2017: Study Conclusions  - Left ventricle: The cavity size was normal. Systolic function was   vigorous. The estimated ejection fraction was in the range of 65%   to 70%. Wall motion was normal; there were no regional wall   motion abnormalities. Doppler parameters are consistent with   abnormal left ventricular relaxation (grade 1 diastolic   dysfunction). - Aortic valve: Trileaflet; mildly thickened, mildly calcified   leaflets. - Mitral valve: Calcified annulus. - Right ventricle: The cavity size was mildly dilated. Wall   thickness was normal.  EKG:  EKG is performed on June 06, 2018 and demonstrates sinus rhythm, first-degree AV block, and normal overall appearance  Recent Labs: 08/04/2017: ALT 35; BUN 19; Creatinine, Ser 1.35; Potassium 5.1; Sodium 135; TSH 0.871  Recent Lipid Panel No results found for: CHOL, TRIG, HDL, CHOLHDL, VLDL, LDLCALC, LDLDIRECT  Physical Exam:    VS:  BP 132/76   Pulse (!) 58   Ht 6' (1.829 m)   Wt 262 lb 12.8 oz (119.2 kg)   SpO2 97%   BMI 35.64 kg/m     Wt Readings from Last 3 Encounters:  06/06/18 262 lb 12.8 oz (119.2 kg)  11/05/17 263 lb 1.9 oz (119.4 kg)  08/19/17 266 lb (120.7 kg)     GEN: Obese and relatively  unhealthy appearing. No acute distress HEENT: Normal NECK: No JVD. LYMPHATICS: No lymphadenopathy CARDIAC: RRR.  No murmur, gallop, edema VASCULAR: Pulses 2+ bilateral radial and carotid, Bruits are absent in carotid territory RESPIRATORY:  Clear to auscultation without rales, wheezing or rhonchi  ABDOMEN: Soft, non-tender, non-distended, No pulsatile mass, MUSCULOSKELETAL: No deformity  SKIN: Warm and dry NEUROLOGIC:  Alert and oriented x 3 PSYCHIATRIC:  Normal affect   ASSESSMENT:    1. Coronary artery  disease of bypass graft of native heart with stable angina pectoris ([place])   2. Chronic diastolic heart failure ([place])   3. Essential hypertension, benign   4. Atypical atrial flutter ([place])   5. Severe obstructive sleep apnea   6. On amiodarone therapy    PLAN:    In order of problems listed above:  1. Stable coronary disease without angina unless dyspnea is an anginal equivalent.  Discussed secondary prevention.  Activity metric of 150 minutes/week stressed.  Weight loss stressed.  Importance of CPAP compliance is stressed. 2. No evidence of volume overload. 3. Target 130/80 mmHg. 4. No recurrence on low-dose amiodarone therapy, 200 mg/day. 5. Not addressed other than encouraging compliance with CPAP. 6. TSH, Westergren sed rate, and liver panel today to monitor for amnio toxicity.  Consider decreasing dose to 100 mg/day when seen in 6 months.  Overall education and awareness concerning primary/secondary risk prevention was discussed in detail: LDL less than 70, hemoglobin A1c less than 7, blood pressure target less than 130/80 mmHg, >150 minutes of moderate aerobic activity per week, avoidance of smoking, weight control (via diet and exercise), and continued surveillance/management of/for obstructive sleep apnea.    Medication Adjustments/Labs and Tests Ordered: Current medicines are reviewed at length with the patient today.  Concerns regarding medicines are outlined above.    No orders of the defined types were placed in this encounter.  No orders of the defined types were placed in this encounter.   There are no Patient Instructions on file for this visit.   Signed, [name], MD  06/06/2018 10:36 AM    [place]

## 2018-06-06 ENCOUNTER — Encounter (INDEPENDENT_AMBULATORY_CARE_PROVIDER_SITE_OTHER): Payer: Self-pay

## 2018-06-06 ENCOUNTER — Encounter: Payer: Self-pay | Admitting: Interventional Cardiology

## 2018-06-06 ENCOUNTER — Ambulatory Visit: Payer: PPO | Admitting: Interventional Cardiology

## 2018-06-06 VITALS — BP 132/76 | HR 58 | Ht 72.0 in | Wt 262.8 lb

## 2018-06-06 DIAGNOSIS — Z79899 Other long term (current) drug therapy: Secondary | ICD-10-CM | POA: Diagnosis not present

## 2018-06-06 DIAGNOSIS — G4733 Obstructive sleep apnea (adult) (pediatric): Secondary | ICD-10-CM

## 2018-06-06 DIAGNOSIS — I5032 Chronic diastolic (congestive) heart failure: Secondary | ICD-10-CM

## 2018-06-06 DIAGNOSIS — I10 Essential (primary) hypertension: Secondary | ICD-10-CM

## 2018-06-06 DIAGNOSIS — I25708 Atherosclerosis of coronary artery bypass graft(s), unspecified, with other forms of angina pectoris: Secondary | ICD-10-CM

## 2018-06-06 DIAGNOSIS — I484 Atypical atrial flutter: Secondary | ICD-10-CM

## 2018-06-06 NOTE — Patient Instructions (Signed)
Medication Instructions:  Your physician recommends that you continue on your current medications as directed. Please refer to the Current Medication list given to you today.  If you need a refill on your cardiac medications before your next appointment, please call your pharmacy.   Lab work: Liver, TSH and ESR today  If you have labs (blood work) drawn today and your tests are completely normal, you will receive your results only by: Marland Kitchen MyChart Message (if you have MyChart) OR . A paper copy in the mail If you have any lab test that is abnormal or we need to change your treatment, we will call you to review the results.  Testing/Procedures: None  Follow-Up: At [facility], you and your health needs are our priority.  As part of our continuing mission to provide you with exceptional heart care, we have created designated Provider Care Teams.  These Care Teams include your primary Cardiologist (physician) and Advanced Practice Providers (APPs -  Physician Assistants and Nurse Practitioners) who all work together to provide you with the care you need, when you need it. You will need a follow up appointment in 6 months.  Please call our office 2 months in advance to schedule this appointment.  You may see Dr. Tamala Julian or one of the following Advanced Practice Providers on your designated Care Team:   Truitt Merle, NP Cecilie Kicks, NP . Kathyrn Drown, NP  Any Other Special Instructions Will Be Listed Below (If Applicable).

## 2018-06-07 LAB — TSH: TSH: 1.04 u[IU]/mL (ref 0.450–4.500)

## 2018-06-07 LAB — HEPATIC FUNCTION PANEL
ALK PHOS: 67 IU/L (ref 39–117)
ALT: 24 IU/L (ref 0–44)
AST: 19 IU/L (ref 0–40)
Albumin: 4.5 g/dL (ref 3.5–4.8)
Bilirubin Total: 0.5 mg/dL (ref 0.0–1.2)
Bilirubin, Direct: 0.17 mg/dL (ref 0.00–0.40)
TOTAL PROTEIN: 6.6 g/dL (ref 6.0–8.5)

## 2018-06-07 LAB — SEDIMENTATION RATE: SED RATE: 23 mm/h (ref 0–30)

## 2018-06-21 DIAGNOSIS — I480 Paroxysmal atrial fibrillation: Secondary | ICD-10-CM | POA: Diagnosis not present

## 2018-06-21 DIAGNOSIS — E119 Type 2 diabetes mellitus without complications: Secondary | ICD-10-CM | POA: Diagnosis not present

## 2018-06-21 DIAGNOSIS — M169 Osteoarthritis of hip, unspecified: Secondary | ICD-10-CM | POA: Diagnosis not present

## 2018-06-21 DIAGNOSIS — I2510 Atherosclerotic heart disease of native coronary artery without angina pectoris: Secondary | ICD-10-CM | POA: Diagnosis not present

## 2018-06-21 DIAGNOSIS — E1169 Type 2 diabetes mellitus with other specified complication: Secondary | ICD-10-CM | POA: Diagnosis not present

## 2018-06-21 DIAGNOSIS — Z794 Long term (current) use of insulin: Secondary | ICD-10-CM | POA: Diagnosis not present

## 2018-06-21 DIAGNOSIS — I10 Essential (primary) hypertension: Secondary | ICD-10-CM | POA: Diagnosis not present

## 2018-07-04 ENCOUNTER — Other Ambulatory Visit: Payer: Self-pay | Admitting: Cardiology

## 2018-07-04 DIAGNOSIS — H2513 Age-related nuclear cataract, bilateral: Secondary | ICD-10-CM | POA: Diagnosis not present

## 2018-07-04 DIAGNOSIS — E119 Type 2 diabetes mellitus without complications: Secondary | ICD-10-CM | POA: Diagnosis not present

## 2018-07-22 DIAGNOSIS — G894 Chronic pain syndrome: Secondary | ICD-10-CM | POA: Diagnosis not present

## 2018-07-22 DIAGNOSIS — E1169 Type 2 diabetes mellitus with other specified complication: Secondary | ICD-10-CM | POA: Diagnosis not present

## 2018-07-22 DIAGNOSIS — M5136 Other intervertebral disc degeneration, lumbar region: Secondary | ICD-10-CM | POA: Diagnosis not present

## 2018-07-22 DIAGNOSIS — I10 Essential (primary) hypertension: Secondary | ICD-10-CM | POA: Diagnosis not present

## 2018-07-22 DIAGNOSIS — Z794 Long term (current) use of insulin: Secondary | ICD-10-CM | POA: Diagnosis not present

## 2018-07-22 DIAGNOSIS — M25551 Pain in right hip: Secondary | ICD-10-CM | POA: Diagnosis not present

## 2018-07-27 DIAGNOSIS — M169 Osteoarthritis of hip, unspecified: Secondary | ICD-10-CM | POA: Diagnosis not present

## 2018-07-27 DIAGNOSIS — E119 Type 2 diabetes mellitus without complications: Secondary | ICD-10-CM | POA: Diagnosis not present

## 2018-07-27 DIAGNOSIS — Z7984 Long term (current) use of oral hypoglycemic drugs: Secondary | ICD-10-CM | POA: Diagnosis not present

## 2018-07-27 DIAGNOSIS — Z794 Long term (current) use of insulin: Secondary | ICD-10-CM | POA: Diagnosis not present

## 2018-07-27 DIAGNOSIS — E1169 Type 2 diabetes mellitus with other specified complication: Secondary | ICD-10-CM | POA: Diagnosis not present

## 2018-07-27 DIAGNOSIS — I480 Paroxysmal atrial fibrillation: Secondary | ICD-10-CM | POA: Diagnosis not present

## 2018-07-27 DIAGNOSIS — I2510 Atherosclerotic heart disease of native coronary artery without angina pectoris: Secondary | ICD-10-CM | POA: Diagnosis not present

## 2018-07-27 DIAGNOSIS — I10 Essential (primary) hypertension: Secondary | ICD-10-CM | POA: Diagnosis not present

## 2018-08-10 DIAGNOSIS — I10 Essential (primary) hypertension: Secondary | ICD-10-CM | POA: Diagnosis not present

## 2018-08-10 DIAGNOSIS — I480 Paroxysmal atrial fibrillation: Secondary | ICD-10-CM | POA: Diagnosis not present

## 2018-08-10 DIAGNOSIS — I2510 Atherosclerotic heart disease of native coronary artery without angina pectoris: Secondary | ICD-10-CM | POA: Diagnosis not present

## 2018-08-10 DIAGNOSIS — E119 Type 2 diabetes mellitus without complications: Secondary | ICD-10-CM | POA: Diagnosis not present

## 2018-08-10 DIAGNOSIS — Z794 Long term (current) use of insulin: Secondary | ICD-10-CM | POA: Diagnosis not present

## 2018-08-10 DIAGNOSIS — M169 Osteoarthritis of hip, unspecified: Secondary | ICD-10-CM | POA: Diagnosis not present

## 2018-08-10 DIAGNOSIS — Z7984 Long term (current) use of oral hypoglycemic drugs: Secondary | ICD-10-CM | POA: Diagnosis not present

## 2018-08-10 DIAGNOSIS — E1169 Type 2 diabetes mellitus with other specified complication: Secondary | ICD-10-CM | POA: Diagnosis not present

## 2018-08-22 ENCOUNTER — Other Ambulatory Visit: Payer: Self-pay | Admitting: Cardiology

## 2018-08-25 DIAGNOSIS — M1711 Unilateral primary osteoarthritis, right knee: Secondary | ICD-10-CM | POA: Diagnosis not present

## 2018-08-30 ENCOUNTER — Other Ambulatory Visit: Payer: Self-pay | Admitting: Cardiology

## 2018-10-27 DIAGNOSIS — R21 Rash and other nonspecific skin eruption: Secondary | ICD-10-CM | POA: Diagnosis not present

## 2018-10-31 ENCOUNTER — Other Ambulatory Visit: Payer: Self-pay | Admitting: Cardiology

## 2018-11-14 DIAGNOSIS — Z794 Long term (current) use of insulin: Secondary | ICD-10-CM | POA: Diagnosis not present

## 2018-11-14 DIAGNOSIS — I2510 Atherosclerotic heart disease of native coronary artery without angina pectoris: Secondary | ICD-10-CM | POA: Diagnosis not present

## 2018-11-14 DIAGNOSIS — I10 Essential (primary) hypertension: Secondary | ICD-10-CM | POA: Diagnosis not present

## 2018-11-14 DIAGNOSIS — I480 Paroxysmal atrial fibrillation: Secondary | ICD-10-CM | POA: Diagnosis not present

## 2018-11-14 DIAGNOSIS — E1169 Type 2 diabetes mellitus with other specified complication: Secondary | ICD-10-CM | POA: Diagnosis not present

## 2018-11-14 DIAGNOSIS — E7800 Pure hypercholesterolemia, unspecified: Secondary | ICD-10-CM | POA: Diagnosis not present

## 2018-11-14 DIAGNOSIS — Z5181 Encounter for therapeutic drug level monitoring: Secondary | ICD-10-CM | POA: Diagnosis not present

## 2018-11-14 DIAGNOSIS — Z0000 Encounter for general adult medical examination without abnormal findings: Secondary | ICD-10-CM | POA: Diagnosis not present

## 2018-11-14 DIAGNOSIS — Z1389 Encounter for screening for other disorder: Secondary | ICD-10-CM | POA: Diagnosis not present

## 2018-11-28 DIAGNOSIS — M169 Osteoarthritis of hip, unspecified: Secondary | ICD-10-CM | POA: Diagnosis not present

## 2018-11-28 DIAGNOSIS — I480 Paroxysmal atrial fibrillation: Secondary | ICD-10-CM | POA: Diagnosis not present

## 2018-11-28 DIAGNOSIS — E1169 Type 2 diabetes mellitus with other specified complication: Secondary | ICD-10-CM | POA: Diagnosis not present

## 2018-11-28 DIAGNOSIS — I2510 Atherosclerotic heart disease of native coronary artery without angina pectoris: Secondary | ICD-10-CM | POA: Diagnosis not present

## 2018-11-28 DIAGNOSIS — Z794 Long term (current) use of insulin: Secondary | ICD-10-CM | POA: Diagnosis not present

## 2018-11-28 DIAGNOSIS — E119 Type 2 diabetes mellitus without complications: Secondary | ICD-10-CM | POA: Diagnosis not present

## 2018-11-28 DIAGNOSIS — I10 Essential (primary) hypertension: Secondary | ICD-10-CM | POA: Diagnosis not present

## 2018-12-13 DIAGNOSIS — H10412 Chronic giant papillary conjunctivitis, left eye: Secondary | ICD-10-CM | POA: Diagnosis not present

## 2018-12-23 ENCOUNTER — Telehealth: Payer: Self-pay | Admitting: *Deleted

## 2018-12-23 NOTE — Telephone Encounter (Signed)

## 2018-12-23 NOTE — Progress Notes (Signed)
CARDIOLOGY OFFICE NOTE  Date:  12/26/2018    Joshua Terrell Date of Birth: 1946/03/10 Medical Record #[id number]  PCP:  [name], MD  Cardiologist:  [place]  Chief Complaint  Patient presents with  . Follow-up    Seen for Dr. Tamala Julian & Curt Bears    History of Present Illness: Joshua Terrell is a 73 y.o. male who presents today for a follow up visit. Seen for Dr. Tamala Julian and Dr. Curt Bears.   He has a history of CAD with CABG 1994 including SVG to OM, SVG to RCA, and SVG to diagonal. 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   Last seen here in January by Dr. Tamala Julian - doing ok but noted spell of after dragging a 110 pound deer out he developed SOB - resolved after 5 to 10 minutes.   The patient does not have symptoms concerning for COVID-19 infection (fever, chills, cough, or new shortness of breath).   Comes in today. Here with his wife today. She is "his ears". Doing ok. Has had some headache and general fatigue. No chest pain. Breathing is good. No syncope. Feels like he is doing ok. He has been up in attics, crawlspaces and has been outside with the heat. He gets rid of bees for people. Not able to use sunscreen. No chest pain. No way of checking his BP at home. Overall, feels like he is doing ok. She concurs.    Past Medical History:  Diagnosis Date  . Coronary artery disease    PTCA of the codominant LAD, 1992, CABG July 94  . Diabetes mellitus without complication ([place])   . Diverticulosis   . Erectile dysfunction   . HTN (hypertension)   . Hyperlipidemia   . Myocardial infarction ([place])   . Obesity   . OSA (obstructive sleep apnea)   . Tinnitus     Past Surgical History:  Procedure Laterality Date  . CORONARY ANGIOPLASTY    . CORONARY ARTERY BYPASS GRAFT    . VASECTOMY       Medications: Current Meds  Medication Sig  . amiodarone (PACERONE) 200 MG tablet TAKE 1 TABLET BY MOUTH DAILY  .  apixaban (ELIQUIS) 5 MG TABS tablet Take 5 mg by mouth 2 (two) times daily.  Marland Kitchen atorvastatin (LIPITOR) 80 MG tablet Take 80 mg by mouth daily.   Marland Kitchen diltiazem (CARDIZEM CD) 180 MG 24 hr capsule TAKE 1 CAPSULE BY MOUTH DAILY  . empagliflozin (JARDIANCE) 10 MG TABS tablet Take 10 mg by mouth daily.  . insulin NPH Human (NOVOLIN N) 100 UNIT/ML injection Inject 40 units into the skin each morning and 45 units into the skin each evening.  Marland Kitchen lisinopril-hydrochlorothiazide (PRINZIDE,ZESTORETIC) 20-12.5 MG tablet Take 1 tablet by mouth daily.  . metFORMIN (GLUCOPHAGE-XR) 500 MG 24 hr tablet Take 2 tablets (1000 mg) by mouth twice daily  . Multiple Vitamin (MULTIVITAMIN WITH MINERALS) TABS tablet Take 1 tablet by mouth daily.  . nitroGLYCERIN (NITROSTAT) 0.4 MG SL tablet Place 0.4 mg under the tongue every 5 (five) minutes as needed for chest pain.  Marland Kitchen oxyCODONE-acetaminophen (PERCOCET/ROXICET) 5-325 MG tablet Take 1 tablet by mouth at bedtime as needed (pain).   . TRULICITY 1.5 PI/9.5JO SOPN Inject 1.5 mg into the skin every Sunday.     Allergies: No Known Allergies  Social History: The patient  reports that he has quit smoking. He has never used smokeless tobacco. He reports  that he does not drink alcohol or use drugs.   Family History: The patient's family history includes Healthy in his mother, sister, and sister; Heart attack in his father; Heart disease in his father; Other in his brother.   Review of Systems: Please see the history of present illness.   All other systems are reviewed and negative.   Physical Exam: VS:  BP 122/62 (BP Location: Left Arm, Patient Position: Sitting, Cuff Size: Normal)   Pulse 75   Ht 6' (1.829 m)   Wt 259 lb 6.4 oz (117.7 kg)   BMI 35.18 kg/m  .  BMI Body mass index is 35.18 kg/m.  Wt Readings from Last 3 Encounters:  12/26/18 259 lb 6.4 oz (117.7 kg)  06/06/18 262 lb 12.8 oz (119.2 kg)  11/05/17 263 lb 1.9 oz (119.4 kg)    General: Pleasant. Alert and  in no acute distress. Quite tanned.   HEENT: Normal.  Neck: Supple, no JVD, carotid bruits, or masses noted.  Cardiac: Regular rate and rhythm. No murmurs, rubs, or gallops. No edema.  Respiratory:  Lungs are clear to auscultation bilaterally with normal work of breathing.  GI: Soft and nontender.  MS: No deformity or atrophy. Gait and ROM intact.  Skin: Warm and dry. Color is normal.  Neuro:  Strength and sensation are intact and no gross focal deficits noted.  Psych: Alert, appropriate and with normal affect.   LABORATORY DATA:  EKG:  EKG is ordered today. This demonstrates sinus brady with 1st degree AV block - HR is 75 today.  Lab Results  Component Value Date   WBC 6.2 11/13/2015   HGB 13.2 11/13/2015   HCT 39.8 11/13/2015   PLT 248 11/13/2015   GLUCOSE 161 (H) 08/04/2017   ALT 24 06/06/2018   AST 19 06/06/2018   NA 135 08/04/2017   K 5.1 08/04/2017   CL 97 08/04/2017   CREATININE 1.35 (H) 08/04/2017   BUN 19 08/04/2017   CO2 20 08/04/2017   TSH 1.040 06/06/2018   INR 1.0 11/13/2015       BNP (last 3 results) No results for input(s): BNP in the last 8760 hours.  ProBNP (last 3 results) No results for input(s): PROBNP in the last 8760 hours.   Other Studies Reviewed Today:  2D Doppler echocardiogram July 2017: Study Conclusions  - Left ventricle: The cavity size was normal. Systolic function was vigorous. The estimated ejection fraction was in the range of 65% to 70%. Wall motion was normal; there were no regional wall motion abnormalities. Doppler parameters are consistent with abnormal left ventricular relaxation (grade 1 diastolic dysfunction). - Aortic valve: Trileaflet; mildly thickened, mildly calcified leaflets. - Mitral valve: Calcified annulus. - Right ventricle: The cavity size was mildly dilated. Wall thickness was normal.  Assessment/Plan:  1. CAD with prior CABG - no active chest pain noted. CV risk factor modification.    2. Atypical atrial flutter - on amiodarone - per Dr. Thompson Caul recommendation at his last visit - will cut his dose back to 100 mg a day. 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   3. Chronic anticoagulation - no problems . Has had recent labs.   4. High risk medicine - has had recent labs but needs repeat TSh and CXR - will arrange for today.  He will not use sunscreen due to the nature of his work.   5. HTN - BP looks good - no changes made.   6. HLD - labs from last month  noted - LDL is at goal.   7. OSA - continue with CPAP  8. COVID-19 Education: The signs and symptoms of COVID-19 were discussed with the patient and how to seek care for testing (follow up with PCP or arrange E-visit).  The importance of social distancing, staying at home, hand hygiene and wearing a mask when out in public were discussed today.  Current medicines are reviewed with the patient today.  The patient does not have concerns regarding medicines other than what has been noted above.  The following changes have been made:  See above.  Labs/ tests ordered today include:    Orders Placed This Encounter  Procedures  . EKG 12-Lead     Disposition:   FU with [place] in about 4 months with repeat EKG.    Patient is agreeable to this plan and will call if any problems develop in the interim.   SignedTruitt Merle, NP  12/26/2018 1:52 PM  [facility] [address] [place], [place]  [address] Phone: [phone] Fax: [phone]

## 2018-12-26 ENCOUNTER — Encounter (INDEPENDENT_AMBULATORY_CARE_PROVIDER_SITE_OTHER): Payer: Self-pay

## 2018-12-26 ENCOUNTER — Ambulatory Visit: Payer: PPO | Admitting: Nurse Practitioner

## 2018-12-26 ENCOUNTER — Encounter: Payer: Self-pay | Admitting: Nurse Practitioner

## 2018-12-26 ENCOUNTER — Other Ambulatory Visit: Payer: Self-pay

## 2018-12-26 ENCOUNTER — Ambulatory Visit
Admission: RE | Admit: 2018-12-26 | Discharge: 2018-12-26 | Disposition: A | Discharge status: Home / Self Care | Payer: PPO | Source: Ambulatory Visit | Attending: Nurse Practitioner | Admitting: Nurse Practitioner

## 2018-12-26 VITALS — BP 122/62 | HR 75 | Ht 72.0 in | Wt 259.4 lb

## 2018-12-26 DIAGNOSIS — G4733 Obstructive sleep apnea (adult) (pediatric): Secondary | ICD-10-CM

## 2018-12-26 DIAGNOSIS — I5032 Chronic diastolic (congestive) heart failure: Secondary | ICD-10-CM

## 2018-12-26 DIAGNOSIS — I25708 Atherosclerosis of coronary artery bypass graft(s), unspecified, with other forms of angina pectoris: Secondary | ICD-10-CM

## 2018-12-26 DIAGNOSIS — I10 Essential (primary) hypertension: Secondary | ICD-10-CM

## 2018-12-26 DIAGNOSIS — Z7901 Long term (current) use of anticoagulants: Secondary | ICD-10-CM

## 2018-12-26 DIAGNOSIS — Z79899 Other long term (current) drug therapy: Secondary | ICD-10-CM | POA: Diagnosis not present

## 2018-12-26 DIAGNOSIS — I484 Atypical atrial flutter: Secondary | ICD-10-CM | POA: Diagnosis not present

## 2018-12-26 MED ORDER — AMIODARONE HCL 200 MG PO TABS: 100.0000 mg | ORAL_TABLET | Freq: Every day | ORAL | 11 refills | Status: DC

## 2018-12-26 NOTE — Patient Instructions (Addendum)
After Visit Summary:  We will be checking the following labs today - TSH  Please go to [place] to [place] on the first floor for a chest Xray - you may walk in.     Medication Instructions:    Continue with your current medicines.    If you need a refill on your cardiac medications before your next appointment, please call your pharmacy.     Testing/Procedures To Be Arranged:  N/A  Follow-Up:   See [place] in 4 months - will need repeat EKG on return visit    At [facility], you and your health needs are our priority.  As part of our continuing mission to provide you with exceptional heart care, we have created designated Provider Care Teams.  These Care Teams include your primary Cardiologist (physician) and Advanced Practice Providers (APPs -  Physician Assistants and Nurse Practitioners) who all work together to provide you with the care you need, when you need it.  Special Instructions:  . Stay safe, stay home, wash your hands for at least 20 seconds and wear a mask when out in public.  . It was good to talk with you today.    Call the [place] office at [phone] if you have any questions, problems or concerns.

## 2018-12-27 LAB — TSH: TSH: 0.516 u[IU]/mL (ref 0.450–4.500)

## 2019-01-03 DIAGNOSIS — E119 Type 2 diabetes mellitus without complications: Secondary | ICD-10-CM | POA: Diagnosis not present

## 2019-01-03 DIAGNOSIS — I2510 Atherosclerotic heart disease of native coronary artery without angina pectoris: Secondary | ICD-10-CM | POA: Diagnosis not present

## 2019-01-03 DIAGNOSIS — I480 Paroxysmal atrial fibrillation: Secondary | ICD-10-CM | POA: Diagnosis not present

## 2019-01-03 DIAGNOSIS — Z7984 Long term (current) use of oral hypoglycemic drugs: Secondary | ICD-10-CM | POA: Diagnosis not present

## 2019-01-03 DIAGNOSIS — M169 Osteoarthritis of hip, unspecified: Secondary | ICD-10-CM | POA: Diagnosis not present

## 2019-01-03 DIAGNOSIS — I10 Essential (primary) hypertension: Secondary | ICD-10-CM | POA: Diagnosis not present

## 2019-01-03 DIAGNOSIS — E1169 Type 2 diabetes mellitus with other specified complication: Secondary | ICD-10-CM | POA: Diagnosis not present

## 2019-01-23 ENCOUNTER — Other Ambulatory Visit: Payer: Self-pay | Admitting: Cardiology

## 2019-01-26 ENCOUNTER — Other Ambulatory Visit: Payer: Self-pay | Admitting: Cardiology

## 2019-01-28 ENCOUNTER — Other Ambulatory Visit: Payer: Self-pay | Admitting: Cardiology

## 2019-02-23 ENCOUNTER — Other Ambulatory Visit: Payer: Self-pay | Admitting: Cardiology

## 2019-02-23 MED ORDER — DILTIAZEM HCL ER COATED BEADS 180 MG PO CP24: 180.0000 mg | ORAL_CAPSULE | Freq: Every day | ORAL | 2 refills | Status: DC

## 2019-02-23 NOTE — Telephone Encounter (Signed)
Pt's medication was sent to pt's pharmacy as requested. Confirmation received.  °

## 2019-03-23 DIAGNOSIS — M19071 Primary osteoarthritis, right ankle and foot: Secondary | ICD-10-CM | POA: Diagnosis not present

## 2019-03-23 DIAGNOSIS — S91209A Unspecified open wound of unspecified toe(s) with damage to nail, initial encounter: Secondary | ICD-10-CM | POA: Diagnosis not present

## 2019-03-23 DIAGNOSIS — M79674 Pain in right toe(s): Secondary | ICD-10-CM | POA: Diagnosis not present

## 2019-03-23 DIAGNOSIS — Z23 Encounter for immunization: Secondary | ICD-10-CM | POA: Diagnosis not present

## 2019-03-23 DIAGNOSIS — S91114A Laceration without foreign body of right lesser toe(s) without damage to nail, initial encounter: Secondary | ICD-10-CM | POA: Diagnosis not present

## 2019-03-23 DIAGNOSIS — Z7901 Long term (current) use of anticoagulants: Secondary | ICD-10-CM | POA: Diagnosis not present

## 2019-03-28 DIAGNOSIS — L03119 Cellulitis of unspecified part of limb: Secondary | ICD-10-CM | POA: Diagnosis not present

## 2019-03-28 DIAGNOSIS — L97511 Non-pressure chronic ulcer of other part of right foot limited to breakdown of skin: Secondary | ICD-10-CM | POA: Diagnosis not present

## 2019-03-28 DIAGNOSIS — Z23 Encounter for immunization: Secondary | ICD-10-CM | POA: Diagnosis not present

## 2019-03-28 DIAGNOSIS — E1169 Type 2 diabetes mellitus with other specified complication: Secondary | ICD-10-CM | POA: Diagnosis not present

## 2019-03-28 DIAGNOSIS — Z7984 Long term (current) use of oral hypoglycemic drugs: Secondary | ICD-10-CM | POA: Diagnosis not present

## 2019-04-17 ENCOUNTER — Ambulatory Visit: Payer: PPO | Admitting: Interventional Cardiology

## 2019-04-19 ENCOUNTER — Ambulatory Visit: Payer: PPO | Admitting: Interventional Cardiology

## 2019-05-09 DIAGNOSIS — L600 Ingrowing nail: Secondary | ICD-10-CM | POA: Diagnosis not present

## 2019-05-15 ENCOUNTER — Ambulatory Visit: Payer: PPO | Admitting: Podiatry

## 2019-05-15 ENCOUNTER — Other Ambulatory Visit: Payer: Self-pay

## 2019-05-15 ENCOUNTER — Encounter: Payer: Self-pay | Admitting: Podiatry

## 2019-05-15 VITALS — BP 131/71 | HR 62 | Temp 97.9°F | Resp 16

## 2019-05-15 DIAGNOSIS — L600 Ingrowing nail: Secondary | ICD-10-CM | POA: Diagnosis not present

## 2019-05-15 MED ORDER — NEOMYCIN-POLYMYXIN-HC 3.5-10000-1 OT SOLN: OTIC | 0 refills | Status: DC

## 2019-05-15 NOTE — Progress Notes (Signed)
   Subjective:    Patient ID: Joshua Terrell, male    DOB: 1945-11-07, 73 y.o.   MRN: [id number]  HPI    Review of Systems  All other systems reviewed and are negative.      Objective:   Physical Exam        Assessment & Plan:

## 2019-05-15 NOTE — Progress Notes (Signed)
Subjective:   Patient ID: Christiana Pellant, male   DOB: 73 y.o.   MRN: [id number]   HPI Patient presents stating having a lot of pain with the fifth nail on the right foot stating that it is thick and irritated and he lost it at one time and was on antibiotics.  Patient states he had previous x-rays which were negative and he like this taken care of permanently and patient does not smoke likes to be active and does take low-dose Eliquis   Review of Systems  All other systems reviewed and are negative.       Objective:  Physical Exam Vitals and nursing note reviewed.  Constitutional:      Appearance: He is well-developed.  Pulmonary:     Effort: Pulmonary effort is normal.  Musculoskeletal:        General: Normal range of motion.  Skin:    General: Skin is warm.  Neurological:     Mental Status: He is alert.     Neurovascular status found to be intact muscle strength was adequate range of motion within normal limits with a thickened fifth nail right that is dystrophic painful when pressed on the dorsal direction with no erythema or drainage noted currently.  It appears to be strictly within the nailbed itself and the digits were noted to have good perfusion     Assessment:  Damaged right fifth nail that is thick and painful with palpation     Plan:  H&P reviewed condition and recommended nail removal.  Explained procedure risk and patient wants surgery and today I went ahead and I infiltrated the right fifth digit 60 mg like Marcaine mixture after he signed consent form.  Sterile prep applied to the toe and using sterile instrumentation remove the fifth nail exposed matrix and applied phenol 3 applications 30 seconds followed by alcohol lavage sterile dressing and then applied compression.  Discussed elevation and what to do if it were to bleed and patient will be seen back for [place] to recheck again and was encouraged to call with questions concerns

## 2019-05-15 NOTE — Patient Instructions (Signed)

## 2019-05-30 DIAGNOSIS — E1169 Type 2 diabetes mellitus with other specified complication: Secondary | ICD-10-CM | POA: Diagnosis not present

## 2019-05-30 DIAGNOSIS — E7800 Pure hypercholesterolemia, unspecified: Secondary | ICD-10-CM | POA: Diagnosis not present

## 2019-05-30 DIAGNOSIS — I2510 Atherosclerotic heart disease of native coronary artery without angina pectoris: Secondary | ICD-10-CM | POA: Diagnosis not present

## 2019-05-30 DIAGNOSIS — M169 Osteoarthritis of hip, unspecified: Secondary | ICD-10-CM | POA: Diagnosis not present

## 2019-05-30 DIAGNOSIS — I10 Essential (primary) hypertension: Secondary | ICD-10-CM | POA: Diagnosis not present

## 2019-05-30 DIAGNOSIS — E119 Type 2 diabetes mellitus without complications: Secondary | ICD-10-CM | POA: Diagnosis not present

## 2019-05-30 DIAGNOSIS — I480 Paroxysmal atrial fibrillation: Secondary | ICD-10-CM | POA: Diagnosis not present

## 2019-06-22 DIAGNOSIS — Z7984 Long term (current) use of oral hypoglycemic drugs: Secondary | ICD-10-CM | POA: Diagnosis not present

## 2019-06-22 DIAGNOSIS — E1169 Type 2 diabetes mellitus with other specified complication: Secondary | ICD-10-CM | POA: Diagnosis not present

## 2019-06-22 DIAGNOSIS — I2510 Atherosclerotic heart disease of native coronary artery without angina pectoris: Secondary | ICD-10-CM | POA: Diagnosis not present

## 2019-06-22 DIAGNOSIS — I480 Paroxysmal atrial fibrillation: Secondary | ICD-10-CM | POA: Diagnosis not present

## 2019-06-22 DIAGNOSIS — E119 Type 2 diabetes mellitus without complications: Secondary | ICD-10-CM | POA: Diagnosis not present

## 2019-06-22 DIAGNOSIS — Z794 Long term (current) use of insulin: Secondary | ICD-10-CM | POA: Diagnosis not present

## 2019-06-22 DIAGNOSIS — I10 Essential (primary) hypertension: Secondary | ICD-10-CM | POA: Diagnosis not present

## 2019-06-22 DIAGNOSIS — M169 Osteoarthritis of hip, unspecified: Secondary | ICD-10-CM | POA: Diagnosis not present

## 2019-06-22 DIAGNOSIS — E7800 Pure hypercholesterolemia, unspecified: Secondary | ICD-10-CM | POA: Diagnosis not present

## 2019-07-03 DIAGNOSIS — M7061 Trochanteric bursitis, right hip: Secondary | ICD-10-CM | POA: Diagnosis not present

## 2019-07-03 DIAGNOSIS — M545 Low back pain: Secondary | ICD-10-CM | POA: Diagnosis not present

## 2019-07-06 DIAGNOSIS — E119 Type 2 diabetes mellitus without complications: Secondary | ICD-10-CM | POA: Diagnosis not present

## 2019-07-06 DIAGNOSIS — H52203 Unspecified astigmatism, bilateral: Secondary | ICD-10-CM | POA: Diagnosis not present

## 2019-07-06 DIAGNOSIS — H524 Presbyopia: Secondary | ICD-10-CM | POA: Diagnosis not present

## 2019-07-06 DIAGNOSIS — H2513 Age-related nuclear cataract, bilateral: Secondary | ICD-10-CM | POA: Diagnosis not present

## 2019-07-25 DIAGNOSIS — M545 Low back pain: Secondary | ICD-10-CM | POA: Diagnosis not present

## 2019-07-25 DIAGNOSIS — M7061 Trochanteric bursitis, right hip: Secondary | ICD-10-CM | POA: Diagnosis not present

## 2019-08-08 DIAGNOSIS — M25551 Pain in right hip: Secondary | ICD-10-CM | POA: Diagnosis not present

## 2019-08-08 DIAGNOSIS — I10 Essential (primary) hypertension: Secondary | ICD-10-CM | POA: Diagnosis not present

## 2019-08-08 DIAGNOSIS — E1169 Type 2 diabetes mellitus with other specified complication: Secondary | ICD-10-CM | POA: Diagnosis not present

## 2019-08-08 DIAGNOSIS — Z794 Long term (current) use of insulin: Secondary | ICD-10-CM | POA: Diagnosis not present

## 2019-08-16 DIAGNOSIS — I10 Essential (primary) hypertension: Secondary | ICD-10-CM | POA: Diagnosis not present

## 2019-08-16 DIAGNOSIS — M169 Osteoarthritis of hip, unspecified: Secondary | ICD-10-CM | POA: Diagnosis not present

## 2019-08-16 DIAGNOSIS — E119 Type 2 diabetes mellitus without complications: Secondary | ICD-10-CM | POA: Diagnosis not present

## 2019-08-16 DIAGNOSIS — I480 Paroxysmal atrial fibrillation: Secondary | ICD-10-CM | POA: Diagnosis not present

## 2019-08-16 DIAGNOSIS — E1169 Type 2 diabetes mellitus with other specified complication: Secondary | ICD-10-CM | POA: Diagnosis not present

## 2019-08-16 DIAGNOSIS — E7800 Pure hypercholesterolemia, unspecified: Secondary | ICD-10-CM | POA: Diagnosis not present

## 2019-08-16 DIAGNOSIS — I2510 Atherosclerotic heart disease of native coronary artery without angina pectoris: Secondary | ICD-10-CM | POA: Diagnosis not present

## 2019-08-31 DIAGNOSIS — M5441 Lumbago with sciatica, right side: Secondary | ICD-10-CM | POA: Diagnosis not present

## 2019-08-31 DIAGNOSIS — M1611 Unilateral primary osteoarthritis, right hip: Secondary | ICD-10-CM | POA: Diagnosis not present

## 2019-08-31 DIAGNOSIS — M545 Low back pain: Secondary | ICD-10-CM | POA: Diagnosis not present

## 2019-08-31 DIAGNOSIS — M25551 Pain in right hip: Secondary | ICD-10-CM | POA: Diagnosis not present

## 2019-09-21 DIAGNOSIS — M25551 Pain in right hip: Secondary | ICD-10-CM | POA: Diagnosis not present

## 2019-09-27 ENCOUNTER — Telehealth: Payer: Self-pay | Admitting: Interventional Cardiology

## 2019-09-27 NOTE — Telephone Encounter (Signed)
Spoke with wife and made her aware that I have placed a note in the chart that it is ok for her to come up.  Wife appreciative for call.

## 2019-09-27 NOTE — Telephone Encounter (Signed)
New Message   Patients wife is calling because she would like to come to the appointment due to patient is hard of hearing. Please advise.

## 2019-09-28 DIAGNOSIS — I480 Paroxysmal atrial fibrillation: Secondary | ICD-10-CM | POA: Diagnosis not present

## 2019-09-28 DIAGNOSIS — E119 Type 2 diabetes mellitus without complications: Secondary | ICD-10-CM | POA: Diagnosis not present

## 2019-09-28 DIAGNOSIS — E1169 Type 2 diabetes mellitus with other specified complication: Secondary | ICD-10-CM | POA: Diagnosis not present

## 2019-09-28 DIAGNOSIS — M169 Osteoarthritis of hip, unspecified: Secondary | ICD-10-CM | POA: Diagnosis not present

## 2019-09-28 DIAGNOSIS — I10 Essential (primary) hypertension: Secondary | ICD-10-CM | POA: Diagnosis not present

## 2019-09-28 DIAGNOSIS — I2510 Atherosclerotic heart disease of native coronary artery without angina pectoris: Secondary | ICD-10-CM | POA: Diagnosis not present

## 2019-09-28 DIAGNOSIS — E7800 Pure hypercholesterolemia, unspecified: Secondary | ICD-10-CM | POA: Diagnosis not present

## 2019-10-01 NOTE — Progress Notes (Signed)
Cardiology Office Note:    Date:  10/02/2019   ID:  Joshua Terrell, DOB 01-Feb-1946, MRN [id number]  PCP:  [name], MD  Cardiologist:  [name], MD   Referring MD: [name], MD   Chief Complaint  Patient presents with  . Coronary Artery Disease    History of Present Illness:    Joshua Terrell is a 74 y.o. male with a hx of CAD with CABG 1994 including SVG to OM, SVG to RCA, and SVG to diagonal. 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  He is doing well.  No cardiopulmonary complaints.  He denies angina.  He has not had orthopnea or PND.  There is no peripheral edema.  Past Medical History:  Diagnosis Date  . Coronary artery disease    PTCA of the codominant LAD, 1992, CABG July 94  . Diabetes mellitus without complication ([place])   . Diverticulosis   . Erectile dysfunction   . HTN (hypertension)   . Hyperlipidemia   . Myocardial infarction ([place])   . Obesity   . OSA (obstructive sleep apnea)   . Tinnitus     Past Surgical History:  Procedure Laterality Date  . CORONARY ANGIOPLASTY    . CORONARY ARTERY BYPASS GRAFT    . VASECTOMY      Current Medications: Current Meds  Medication Sig  . amiodarone (PACERONE) 200 MG tablet Take 0.5 tablets (100 mg total) by mouth daily.  Marland Kitchen apixaban (ELIQUIS) 5 MG TABS tablet Take 5 mg by mouth 2 (two) times daily.  Marland Kitchen atorvastatin (LIPITOR) 80 MG tablet Take 80 mg by mouth daily.   Marland Kitchen diltiazem (CARDIZEM CD) 180 MG 24 hr capsule Take 1 capsule (180 mg total) by mouth daily.  . empagliflozin (JARDIANCE) 10 MG TABS tablet Take 10 mg by mouth daily.  . insulin NPH Human (NOVOLIN N) 100 UNIT/ML injection Inject 40 units into the skin each morning and 45 units into the skin each evening.  Marland Kitchen lisinopril-hydrochlorothiazide (PRINZIDE,ZESTORETIC) 20-12.5 MG tablet  Take 1 tablet by mouth daily.  . meloxicam (MOBIC) 15 MG tablet Take 15 mg by mouth daily.  . metFORMIN (GLUCOPHAGE-XR) 500 MG 24 hr tablet Take 2 tablets (1000 mg) by mouth twice daily  . Multiple Vitamin (MULTIVITAMIN WITH MINERALS) TABS tablet Take 1 tablet by mouth daily.  Marland Kitchen neomycin-polymyxin-hydrocortisone (CORTISPORIN) OTIC solution Apply 1-2 drops to toe after soaking twice a day  . nitroGLYCERIN (NITROSTAT) 0.4 MG SL tablet Place 0.4 mg under the tongue every 5 (five) minutes as needed for chest pain.  Marland Kitchen oxyCODONE-acetaminophen (PERCOCET/ROXICET) 5-325 MG tablet Take 1 tablet by mouth at bedtime as needed (pain).   . TRULICITY 1.5 0000000 SOPN Inject 1.5 mg into the skin every Sunday.     Allergies:   Patient has no known allergies.   Social History   Socioeconomic History  . Marital status: Married    Spouse name: Not on file  . Number of children: Not on file  . Years of education: Not on file  . Highest education level: Not on file  Occupational History  . Not on file  Tobacco Use  . Smoking status: Former Research scientist (life sciences)  . Smokeless tobacco: Never Used  Substance and Sexual Activity  . Alcohol use: No    Alcohol/week: 0.0 standard drinks  . Drug use:  No  . Sexual activity: Not on file  Other Topics Concern  . Not on file  Social History Narrative  . Not on file   Social Determinants of Health   Financial Resource Strain:   . Difficulty of Paying Living Expenses:   Food Insecurity:   . Worried About Charity fundraiser in the Last Year:   . Arboriculturist in the Last Year:   Transportation Needs:   . Film/video editor (Medical):   Marland Kitchen Lack of Transportation (Non-Medical):   Physical Activity:   . Days of Exercise per Week:   . Minutes of Exercise per Session:   Stress:   . Feeling of Stress :   Social Connections:   . Frequency of Communication with Friends and Family:   . Frequency of Social Gatherings with Friends and Family:   . Attends Religious  Services:   . Active Member of Clubs or Organizations:   . Attends Archivist Meetings:   Marland Kitchen Marital Status:      Family History: The patient's family history includes Healthy in his mother, sister, and sister; Heart attack in his father; Heart disease in his father; Other in his brother.  ROS:   Please see the history of present illness.    Having abdominal cramping this morning.  All other systems reviewed and are negative.  EKGs/Labs/Other Studies Reviewed:    The following studies were reviewed today: No new or recent follow-up data.  EKG:  EKG sinus bradycardia, normal appearance.  Recent Labs: 12/26/2018: TSH 0.516  Recent Lipid Panel No results found for: CHOL, TRIG, HDL, CHOLHDL, VLDL, LDLCALC, LDLDIRECT  Physical Exam:    VS:  BP (!) 158/78   Pulse (!) 58   Ht 6' (1.829 m)   Wt 269 lb 3.2 oz (122.1 kg)   BMI 36.51 kg/m     Wt Readings from Last 3 Encounters:  10/02/19 269 lb 3.2 oz (122.1 kg)  12/26/18 259 lb 6.4 oz (117.7 kg)  06/06/18 262 lb 12.8 oz (119.2 kg)     GEN: Obese. No acute distress HEENT: Normal NECK: No JVD. LYMPHATICS: No lymphadenopathy CARDIAC:  RRR without murmur, gallop, or edema. VASCULAR:  Normal Pulses. No bruits. RESPIRATORY:  Clear to auscultation without rales, wheezing or rhonchi  ABDOMEN: Soft, non-tender, non-distended, No pulsatile mass, MUSCULOSKELETAL: No deformity  SKIN: Warm and dry NEUROLOGIC:  Alert and oriented x 3 PSYCHIATRIC:  Normal affect   ASSESSMENT:    1. Coronary artery disease of bypass graft of native heart with stable angina pectoris ([place])   2. Chronic diastolic heart failure ([place])   3. On amiodarone therapy   4. Atypical atrial flutter ([place])   5. OSA (obstructive sleep apnea)   6. Essential hypertension, benign   7. Chronic anticoagulation   8. Educated about COVID-19 virus infection    PLAN:    In order of problems listed above:  1. There is no current angina.  Secondary prevention  discussed in some detail. 2. No volume overload or evidence of failure. 3. Very low-dose amiodarone, 100 mg/day.  TSH and liver panel will be done in 6 months. 4. No recurrence of atypical flutter. 5. Compliant with CPAP.  Sleeps well. 6. Initial blood pressure 158/78 resolved 130/78 mmHg upon resting. 7. No bleeding on apixaban. [place] received.  Social distancing is being practiced.   Medication Adjustments/Labs and Tests Ordered: Current medicines are reviewed at length with the patient today.  Concerns regarding medicines are outlined above.  Orders Placed This Encounter  Procedures  . EKG 12-Lead   No orders of the defined types were placed in this encounter.   There are no Patient Instructions on file for this visit.   Signed, [name], MD  10/02/2019 11:32 AM    [place]

## 2019-10-02 ENCOUNTER — Other Ambulatory Visit: Payer: Self-pay

## 2019-10-02 ENCOUNTER — Encounter: Payer: Self-pay | Admitting: Interventional Cardiology

## 2019-10-02 ENCOUNTER — Ambulatory Visit: Payer: PPO | Admitting: Interventional Cardiology

## 2019-10-02 VITALS — BP 158/78 | HR 58 | Ht 72.0 in | Wt 269.2 lb

## 2019-10-02 DIAGNOSIS — I5032 Chronic diastolic (congestive) heart failure: Secondary | ICD-10-CM

## 2019-10-02 DIAGNOSIS — I10 Essential (primary) hypertension: Secondary | ICD-10-CM

## 2019-10-02 DIAGNOSIS — I484 Atypical atrial flutter: Secondary | ICD-10-CM

## 2019-10-02 DIAGNOSIS — G4733 Obstructive sleep apnea (adult) (pediatric): Secondary | ICD-10-CM | POA: Diagnosis not present

## 2019-10-02 DIAGNOSIS — Z79899 Other long term (current) drug therapy: Secondary | ICD-10-CM

## 2019-10-02 DIAGNOSIS — I25708 Atherosclerosis of coronary artery bypass graft(s), unspecified, with other forms of angina pectoris: Secondary | ICD-10-CM

## 2019-10-02 DIAGNOSIS — Z7901 Long term (current) use of anticoagulants: Secondary | ICD-10-CM

## 2019-10-02 DIAGNOSIS — Z7189 Other specified counseling: Secondary | ICD-10-CM | POA: Diagnosis not present

## 2019-10-02 NOTE — Patient Instructions (Signed)
Medication Instructions:  Your physician recommends that you continue on your current medications as directed. Please refer to the Current Medication list given to you today.  *If you need a refill on your cardiac medications before your next appointment, please call your pharmacy*   Lab Work: You will need to come fasting to your next appointment with Dr. Tamala Julian so we can check your cholesterol.  If you have labs (blood work) drawn today and your tests are completely normal, you will receive your results only by: Marland Kitchen MyChart Message (if you have MyChart) OR . A paper copy in the mail If you have any lab test that is abnormal or we need to change your treatment, we will call you to review the results.   Testing/Procedures: None   Follow-Up: At [facility], you and your health needs are our priority.  As part of our continuing mission to provide you with exceptional heart care, we have created designated Provider Care Teams.  These Care Teams include your primary Cardiologist (physician) and Advanced Practice Providers (APPs -  Physician Assistants and Nurse Practitioners) who all work together to provide you with the care you need, when you need it.  We recommend signing up for the patient portal called "MyChart".  Sign up information is provided on this After Visit Summary.  MyChart is used to connect with patients for Virtual Visits (Telemedicine).  Patients are able to view lab/test results, encounter notes, upcoming appointments, etc.  Non-urgent messages can be sent to your provider as well.   To learn more about what you can do with MyChart, go to [email or web address].    Your next appointment:   6 month(s)  The format for your next appointment:   In Person  Provider:   You may see [name], MD or one of the following Advanced Practice Providers on your designated Care Team:    Truitt Merle, NP  Cecilie Kicks, NP  Kathyrn Drown, NP    Other  Instructions

## 2019-10-05 DIAGNOSIS — I480 Paroxysmal atrial fibrillation: Secondary | ICD-10-CM | POA: Diagnosis not present

## 2019-10-05 DIAGNOSIS — E119 Type 2 diabetes mellitus without complications: Secondary | ICD-10-CM | POA: Diagnosis not present

## 2019-10-05 DIAGNOSIS — I2510 Atherosclerotic heart disease of native coronary artery without angina pectoris: Secondary | ICD-10-CM | POA: Diagnosis not present

## 2019-10-05 DIAGNOSIS — I10 Essential (primary) hypertension: Secondary | ICD-10-CM | POA: Diagnosis not present

## 2019-10-05 DIAGNOSIS — E1169 Type 2 diabetes mellitus with other specified complication: Secondary | ICD-10-CM | POA: Diagnosis not present

## 2019-10-05 DIAGNOSIS — E7800 Pure hypercholesterolemia, unspecified: Secondary | ICD-10-CM | POA: Diagnosis not present

## 2019-10-05 DIAGNOSIS — M169 Osteoarthritis of hip, unspecified: Secondary | ICD-10-CM | POA: Diagnosis not present

## 2019-11-20 DIAGNOSIS — Z6835 Body mass index (BMI) 35.0-35.9, adult: Secondary | ICD-10-CM | POA: Diagnosis not present

## 2019-11-20 DIAGNOSIS — E7800 Pure hypercholesterolemia, unspecified: Secondary | ICD-10-CM | POA: Diagnosis not present

## 2019-11-20 DIAGNOSIS — G4733 Obstructive sleep apnea (adult) (pediatric): Secondary | ICD-10-CM | POA: Diagnosis not present

## 2019-11-20 DIAGNOSIS — Z0000 Encounter for general adult medical examination without abnormal findings: Secondary | ICD-10-CM | POA: Diagnosis not present

## 2019-11-20 DIAGNOSIS — E1169 Type 2 diabetes mellitus with other specified complication: Secondary | ICD-10-CM | POA: Diagnosis not present

## 2019-11-20 DIAGNOSIS — M5136 Other intervertebral disc degeneration, lumbar region: Secondary | ICD-10-CM | POA: Diagnosis not present

## 2019-11-20 DIAGNOSIS — H9319 Tinnitus, unspecified ear: Secondary | ICD-10-CM | POA: Diagnosis not present

## 2019-11-20 DIAGNOSIS — I2510 Atherosclerotic heart disease of native coronary artery without angina pectoris: Secondary | ICD-10-CM | POA: Diagnosis not present

## 2019-11-20 DIAGNOSIS — Z1389 Encounter for screening for other disorder: Secondary | ICD-10-CM | POA: Diagnosis not present

## 2019-11-20 DIAGNOSIS — Z794 Long term (current) use of insulin: Secondary | ICD-10-CM | POA: Diagnosis not present

## 2019-11-20 DIAGNOSIS — I480 Paroxysmal atrial fibrillation: Secondary | ICD-10-CM | POA: Diagnosis not present

## 2019-11-20 DIAGNOSIS — H9191 Unspecified hearing loss, right ear: Secondary | ICD-10-CM | POA: Diagnosis not present

## 2019-11-20 DIAGNOSIS — I10 Essential (primary) hypertension: Secondary | ICD-10-CM | POA: Diagnosis not present

## 2019-11-21 DIAGNOSIS — H25013 Cortical age-related cataract, bilateral: Secondary | ICD-10-CM | POA: Diagnosis not present

## 2019-11-21 DIAGNOSIS — H2511 Age-related nuclear cataract, right eye: Secondary | ICD-10-CM | POA: Diagnosis not present

## 2019-11-21 DIAGNOSIS — H2513 Age-related nuclear cataract, bilateral: Secondary | ICD-10-CM | POA: Diagnosis not present

## 2019-11-21 DIAGNOSIS — H31002 Unspecified chorioretinal scars, left eye: Secondary | ICD-10-CM | POA: Diagnosis not present

## 2019-11-21 DIAGNOSIS — H25043 Posterior subcapsular polar age-related cataract, bilateral: Secondary | ICD-10-CM | POA: Diagnosis not present

## 2019-11-29 DIAGNOSIS — M169 Osteoarthritis of hip, unspecified: Secondary | ICD-10-CM | POA: Diagnosis not present

## 2019-11-29 DIAGNOSIS — H43813 Vitreous degeneration, bilateral: Secondary | ICD-10-CM | POA: Diagnosis not present

## 2019-11-29 DIAGNOSIS — E7800 Pure hypercholesterolemia, unspecified: Secondary | ICD-10-CM | POA: Diagnosis not present

## 2019-11-29 DIAGNOSIS — I10 Essential (primary) hypertension: Secondary | ICD-10-CM | POA: Diagnosis not present

## 2019-11-29 DIAGNOSIS — H43393 Other vitreous opacities, bilateral: Secondary | ICD-10-CM | POA: Diagnosis not present

## 2019-11-29 DIAGNOSIS — H31092 Other chorioretinal scars, left eye: Secondary | ICD-10-CM | POA: Diagnosis not present

## 2019-11-29 DIAGNOSIS — I480 Paroxysmal atrial fibrillation: Secondary | ICD-10-CM | POA: Diagnosis not present

## 2019-11-29 DIAGNOSIS — H35433 Paving stone degeneration of retina, bilateral: Secondary | ICD-10-CM | POA: Diagnosis not present

## 2019-11-29 DIAGNOSIS — E1169 Type 2 diabetes mellitus with other specified complication: Secondary | ICD-10-CM | POA: Diagnosis not present

## 2019-11-29 DIAGNOSIS — I2510 Atherosclerotic heart disease of native coronary artery without angina pectoris: Secondary | ICD-10-CM | POA: Diagnosis not present

## 2019-12-06 DIAGNOSIS — L905 Scar conditions and fibrosis of skin: Secondary | ICD-10-CM | POA: Diagnosis not present

## 2019-12-06 DIAGNOSIS — D485 Neoplasm of uncertain behavior of skin: Secondary | ICD-10-CM | POA: Diagnosis not present

## 2019-12-06 DIAGNOSIS — Z1283 Encounter for screening for malignant neoplasm of skin: Secondary | ICD-10-CM | POA: Diagnosis not present

## 2019-12-06 DIAGNOSIS — D225 Melanocytic nevi of trunk: Secondary | ICD-10-CM | POA: Diagnosis not present

## 2019-12-11 DIAGNOSIS — H2511 Age-related nuclear cataract, right eye: Secondary | ICD-10-CM | POA: Diagnosis not present

## 2019-12-12 DIAGNOSIS — H2512 Age-related nuclear cataract, left eye: Secondary | ICD-10-CM | POA: Diagnosis not present

## 2019-12-19 DIAGNOSIS — H4710 Unspecified papilledema: Secondary | ICD-10-CM | POA: Diagnosis not present

## 2019-12-25 ENCOUNTER — Telehealth: Payer: Self-pay | Admitting: *Deleted

## 2019-12-25 NOTE — Telephone Encounter (Signed)
   [facility] Pre-operative Risk Assessment    HEARTCARE STAFF: - Please ensure there is not already an duplicate clearance open for this procedure. - Under Visit Info/Reason for Call, type in Other and utilize the format Clearance MM/DD/YY or Clearance TBD. Do not use dashes or single digits. - If request is for dental extraction, please clarify the # of teeth to be extracted.  Request for surgical clearance:  1. What type of surgery is being performed? CATARACT EXTRACTION w/INTRAOCULAR LENS IMPLANT OF THE LEFT EYE   2. When is this surgery scheduled? 01/08/20   3. What type of clearance is required (medical clearance vs. Pharmacy clearance to hold med vs. Both)? MEDICAL  4. Are there any medications that need to be held prior to surgery and how long? PER DR. BEVIS NO NEED TO HOLD ANY MEDICATIONS INCLUDING ANY BLOOD THINNERS  5. Practice name and name of physician performing surgery? [facility]; DR. Christia Reading BEVIS   6. What is the office phone number? [phone]   7.   What is the office fax number? [phone]  8.   Anesthesia type (None, local, MAC, general) ? TOPICAL WITH IV MEDICATION   Julaine Hua 12/25/2019, 10:20 AM  _________________________________________________________________   (provider comments below)

## 2019-12-25 NOTE — Telephone Encounter (Signed)
   Primary Cardiologist: [name], MD  Chart reviewed as part of pre-operative protocol coverage. Cataract extractions are recognized in guidelines as low risk surgeries that do not typically require specific preoperative testing or holding of blood thinner therapy. Therefore, given past medical history and time since last visit, based on ACC/AHA guidelines, Joshua Terrell would be at acceptable risk for the planned procedure without further cardiovascular testing.   I will route this recommendation to the requesting party via Epic fax function and remove from pre-op pool.  Please call with questions.  [place], [place] 12/25/2019, 6:29 PM

## 2020-01-08 DIAGNOSIS — H2512 Age-related nuclear cataract, left eye: Secondary | ICD-10-CM | POA: Diagnosis not present

## 2020-01-12 DIAGNOSIS — M169 Osteoarthritis of hip, unspecified: Secondary | ICD-10-CM | POA: Diagnosis not present

## 2020-01-12 DIAGNOSIS — E1169 Type 2 diabetes mellitus with other specified complication: Secondary | ICD-10-CM | POA: Diagnosis not present

## 2020-01-12 DIAGNOSIS — E7800 Pure hypercholesterolemia, unspecified: Secondary | ICD-10-CM | POA: Diagnosis not present

## 2020-01-12 DIAGNOSIS — I10 Essential (primary) hypertension: Secondary | ICD-10-CM | POA: Diagnosis not present

## 2020-01-12 DIAGNOSIS — I2510 Atherosclerotic heart disease of native coronary artery without angina pectoris: Secondary | ICD-10-CM | POA: Diagnosis not present

## 2020-01-12 DIAGNOSIS — I480 Paroxysmal atrial fibrillation: Secondary | ICD-10-CM | POA: Diagnosis not present

## 2020-01-16 ENCOUNTER — Emergency Department (HOSPITAL_COMMUNITY): Payer: PPO

## 2020-01-16 ENCOUNTER — Emergency Department (HOSPITAL_COMMUNITY)
Admission: EM | Admit: 2020-01-16 | Discharge: 2020-01-17 | Disposition: A | Discharge status: Home / Self Care | Payer: PPO | Attending: Emergency Medicine | Admitting: Emergency Medicine

## 2020-01-16 ENCOUNTER — Encounter (HOSPITAL_COMMUNITY): Payer: Self-pay | Admitting: Emergency Medicine

## 2020-01-16 ENCOUNTER — Other Ambulatory Visit: Payer: Self-pay

## 2020-01-16 DIAGNOSIS — R519 Headache, unspecified: Secondary | ICD-10-CM | POA: Diagnosis not present

## 2020-01-16 DIAGNOSIS — H4710 Unspecified papilledema: Secondary | ICD-10-CM | POA: Diagnosis not present

## 2020-01-16 DIAGNOSIS — H5710 Ocular pain, unspecified eye: Secondary | ICD-10-CM | POA: Diagnosis not present

## 2020-01-16 DIAGNOSIS — H5789 Other specified disorders of eye and adnexa: Secondary | ICD-10-CM | POA: Insufficient documentation

## 2020-01-16 DIAGNOSIS — Z5321 Procedure and treatment not carried out due to patient leaving prior to being seen by health care provider: Secondary | ICD-10-CM | POA: Insufficient documentation

## 2020-01-16 LAB — I-STAT CHEM 8, ED
BUN: 24 mg/dL — ABNORMAL HIGH (ref 8–23)
Calcium, Ion: 1.26 mmol/L (ref 1.15–1.40)
Chloride: 106 mmol/L (ref 98–111)
Creatinine, Ser: 1.2 mg/dL (ref 0.61–1.24)
Glucose, Bld: 112 mg/dL — ABNORMAL HIGH (ref 70–99)
HCT: 41 % (ref 39.0–52.0)
Hemoglobin: 13.9 g/dL (ref 13.0–17.0)
Potassium: 4.1 mmol/L (ref 3.5–5.1)
Sodium: 145 mmol/L (ref 135–145)
TCO2: 25 mmol/L (ref 22–32)

## 2020-01-16 LAB — CBC WITH DIFFERENTIAL/PLATELET
Abs Immature Granulocytes: 0.02 10*3/uL (ref 0.00–0.07)
Basophils Absolute: 0 10*3/uL (ref 0.0–0.1)
Basophils Relative: 1 %
Eosinophils Absolute: 0.2 10*3/uL (ref 0.0–0.5)
Eosinophils Relative: 3 %
HCT: 41.9 % (ref 39.0–52.0)
Hemoglobin: 13.3 g/dL (ref 13.0–17.0)
Immature Granulocytes: 0 %
Lymphocytes Relative: 18 %
Lymphs Abs: 1.2 10*3/uL (ref 0.7–4.0)
MCH: 30 pg (ref 26.0–34.0)
MCHC: 31.7 g/dL (ref 30.0–36.0)
MCV: 94.6 fL (ref 80.0–100.0)
Monocytes Absolute: 0.4 10*3/uL (ref 0.1–1.0)
Monocytes Relative: 6 %
Neutro Abs: 4.6 10*3/uL (ref 1.7–7.7)
Neutrophils Relative %: 72 %
Platelets: 263 10*3/uL (ref 150–400)
RBC: 4.43 MIL/uL (ref 4.22–5.81)
RDW: 14.3 % (ref 11.5–15.5)
WBC: 6.4 10*3/uL (ref 4.0–10.5)
nRBC: 0 % (ref 0.0–0.2)

## 2020-01-16 LAB — BASIC METABOLIC PANEL
Anion gap: 10 (ref 5–15)
BUN: 23 mg/dL (ref 8–23)
CO2: 25 mmol/L (ref 22–32)
Calcium: 9.7 mg/dL (ref 8.9–10.3)
Chloride: 108 mmol/L (ref 98–111)
Creatinine, Ser: 1.21 mg/dL (ref 0.61–1.24)
GFR calc Af Amer: >60 mL/min (ref >60)
GFR calc non Af Amer: 58 mL/min — ABNORMAL LOW (ref >60)
Glucose, Bld: 118 mg/dL — ABNORMAL HIGH (ref 70–99)
Potassium: 4.2 mmol/L (ref 3.5–5.1)
Sodium: 143 mmol/L (ref 135–145)

## 2020-01-16 MED ORDER — GADOBUTROL 1 MMOL/ML IV SOLN: 10.0000 mL | Freq: Once | INTRAVENOUS | Status: DC | PRN

## 2020-01-16 MED ORDER — GADOBUTROL 1 MMOL/ML IV SOLN
10.0000 mL | Freq: Once | INTRAVENOUS | Status: AC | PRN
  Administered 2020-01-16: 10 mL via INTRAVENOUS

## 2020-01-16 NOTE — ED Triage Notes (Signed)
Sent by ophthalmologist for eval of headache/L eye pressure X1 week. Sent for CT orbit

## 2020-01-16 NOTE — ED Provider Notes (Signed)
Imaging modality reviewed with radiology, recommends MRI over CT. Orders placed.    Crimson Beer, [place] N, PA-C 01/16/20 2026    Drenda Freeze, MD 01/16/20 2217

## 2020-01-17 ENCOUNTER — Ambulatory Visit (INDEPENDENT_AMBULATORY_CARE_PROVIDER_SITE_OTHER): Payer: PPO | Admitting: Ophthalmology

## 2020-01-17 ENCOUNTER — Other Ambulatory Visit: Payer: Self-pay

## 2020-01-17 ENCOUNTER — Encounter (INDEPENDENT_AMBULATORY_CARE_PROVIDER_SITE_OTHER): Payer: Self-pay | Admitting: Ophthalmology

## 2020-01-17 DIAGNOSIS — H43813 Vitreous degeneration, bilateral: Secondary | ICD-10-CM | POA: Diagnosis not present

## 2020-01-17 DIAGNOSIS — H5010 Unspecified exotropia: Secondary | ICD-10-CM

## 2020-01-17 DIAGNOSIS — T380X5A Adverse effect of glucocorticoids and synthetic analogues, initial encounter: Secondary | ICD-10-CM

## 2020-01-17 DIAGNOSIS — G4733 Obstructive sleep apnea (adult) (pediatric): Secondary | ICD-10-CM | POA: Diagnosis not present

## 2020-01-17 DIAGNOSIS — H47013 Ischemic optic neuropathy, bilateral: Secondary | ICD-10-CM | POA: Diagnosis not present

## 2020-01-17 DIAGNOSIS — H4062X0 Glaucoma secondary to drugs, left eye, stage unspecified: Secondary | ICD-10-CM

## 2020-01-17 NOTE — Assessment & Plan Note (Signed)
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  Also has a history of vasculopathy peripherally.  Although there is no diabetic retinopathy.  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  Patient tends to be self-limited tends to subside over the ensuing weeks to months.  Patient has a proper work-up including MR vascular study yesterday which was normal as well as MRI of the orbits and globes which showed disclose no evidence of optic nerve compression, normal extraocular muscles and no mass-effect.  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  The sed rate was reported as normal yet the patient does take meloxicam daily.  This can artifactually lower it the sed rate but he has no other stigmata of temporal arteritis.  I will simply reassure the patient that this condition is likely to be self-limited and not progressive.  If any visual acuity fluctuation or dimming or darkening of the vision occurs he should proceed emergently to the emergency room and tell them that he has swelling of the nerves and vision loss.

## 2020-01-17 NOTE — Progress Notes (Signed)
01/17/2020     CHIEF COMPLAINT Patient presents for Retina Evaluation   HISTORY OF PRESENT ILLNESS: Joshua Terrell is a 74 y.o. male who presents to the clinic today for:   HPI    Retina Evaluation    In right eye.  This started 1 month ago.  Duration of 1 month.  Context:  distance vision, mid-range vision and near vision.  Treatments tried include eye drops.  Response to treatment was no improvement.          Comments    NP ONH EDEMA - Ref'd by Dr. Talbert Forest  Pt c/o blurry VA OD since CEIOL x 1 month ago. Pt denies changes to VA OS. Pt c/o slight dull ache off and on OU.       Last edited by Rockie Neighbours, [place] on 01/17/2020  2:59 PM. (History)      Referring physician: Darleen Crocker, MD [address] [place],  [place] [address]  HISTORICAL INFORMATION:   Selected notes from the MEDICAL RECORD NUMBER       CURRENT MEDICATIONS: Current Outpatient Medications (Ophthalmic Drugs)  Medication Sig  . BESIVANCE 0.6 % SUSP Place 1 drop into the left eye 3 (three) times daily.  . brinzolamide (AZOPT) 1 % ophthalmic suspension Place 1 drop into the left eye at bedtime.  . DUREZOL 0.05 % EMUL Place 1 drop into the left eye 3 (three) times daily.  Marland Kitchen PROLENSA 0.07 % SOLN SMARTSIG:1 Drop(s) In Eye(s) Every Evening   No current facility-administered medications for this visit. (Ophthalmic Drugs)   Current Outpatient Medications (Other)  Medication Sig  . amiodarone (PACERONE) 200 MG tablet Take 0.5 tablets (100 mg total) by mouth daily.  Marland Kitchen apixaban (ELIQUIS) 5 MG TABS tablet Take 5 mg by mouth 2 (two) times daily.  Marland Kitchen atorvastatin (LIPITOR) 80 MG tablet Take 80 mg by mouth daily.   Marland Kitchen diltiazem (CARDIZEM CD) 180 MG 24 hr capsule Take 1 capsule (180 mg total) by mouth daily.  . empagliflozin (JARDIANCE) 10 MG TABS tablet Take 10 mg by mouth daily.  . insulin NPH Human (NOVOLIN N) 100 UNIT/ML injection Inject 40 units into the skin each morning and 45 units into the skin  each evening.  Marland Kitchen lisinopril-hydrochlorothiazide (PRINZIDE,ZESTORETIC) 20-12.5 MG tablet Take 1 tablet by mouth daily.  . meloxicam (MOBIC) 15 MG tablet Take 15 mg by mouth daily.  . metFORMIN (GLUCOPHAGE-XR) 500 MG 24 hr tablet Take 2 tablets (1000 mg) by mouth twice daily  . Multiple Vitamin (MULTIVITAMIN WITH MINERALS) TABS tablet Take 1 tablet by mouth daily.  Marland Kitchen neomycin-polymyxin-hydrocortisone (CORTISPORIN) OTIC solution Apply 1-2 drops to toe after soaking twice a day  . nitroGLYCERIN (NITROSTAT) 0.4 MG SL tablet Place 0.4 mg under the tongue every 5 (five) minutes as needed for chest pain.  Marland Kitchen oxyCODONE-acetaminophen (PERCOCET/ROXICET) 5-325 MG tablet Take 1 tablet by mouth at bedtime as needed (pain).   . TRULICITY 1.5 UE/4.5WU SOPN Inject 1.5 mg into the skin every Sunday.   No current facility-administered medications for this visit. (Other)      REVIEW OF SYSTEMS:    ALLERGIES No Known Allergies  PAST MEDICAL HISTORY Past Medical History:  Diagnosis Date  . Coronary artery disease    PTCA of the codominant LAD, 1992, CABG July 94  . Diabetes mellitus without complication ([place])   . Diverticulosis   . Erectile dysfunction   . HTN (hypertension)   . Hyperlipidemia   . Myocardial infarction ([place])   .  Obesity   . OSA (obstructive sleep apnea)   . Tinnitus    Past Surgical History:  Procedure Laterality Date  . CORONARY ANGIOPLASTY    . CORONARY ARTERY BYPASS GRAFT    . VASECTOMY      FAMILY HISTORY Family History  Problem Relation Age of Onset  . Healthy Mother   . Heart attack Father   . Heart disease Father   . Healthy Sister   . Other Brother        [place]  . Healthy Sister     SOCIAL HISTORY Social History   Tobacco Use  . Smoking status: Former Research scientist (life sciences)  . Smokeless tobacco: Never Used  Vaping Use  . Vaping Use: Never used  Substance Use Topics  . Alcohol use: No    Alcohol/week: 0.0 standard drinks  . Drug use: No          OPHTHALMIC EXAM:  Base Eye Exam    Visual Acuity (ETDRS)      Right Left   Dist [place] 20/40 -3 20/20   Dist ph [place] 20/40 +2        Tonometry (Tonopen, 3:07 PM)      Right Left   Pressure 17 30       Pupils      Pupils Dark Light Shape React APD   Right PERRL 4 3 Round Brisk None   Left PERRL 4 3 Round Brisk None       Visual Fields (Counting fingers)      Left Right    Full Full       Extraocular Movement   OD XT with upward gaze  By tech.   On ductions and versions, patient has disconjugate gaze in the extremes of gaze including a V-pattern type exotropia superiorly and inverted V pattern upon downward gaze.  He has no complaints of double vision        Neuro/Psych    Oriented x3: Yes   Mood/Affect: Normal       Dilation    Both eyes: 1.0% Mydriacyl, 2.5% Phenylephrine @ 4:01 PM  Defer per GAR        Slit Lamp and Fundus Exam    External Exam      Right Left   External Normal Normal       Slit Lamp Exam      Right Left   Lids/Lashes Normal Normal   Conjunctiva/Sclera White and quiet White and quiet   Cornea Clear Clear   Anterior Chamber Deep and quiet Deep and quiet   Iris Round and reactive Round and reactive   Lens Posterior chamber intraocular lens Posterior chamber intraocular lens   Anterior Vitreous Normal Normal       Fundus Exam      Right Left   Posterior Vitreous Posterior vitreous detachment Posterior vitreous detachment   Disc small peripapillary deep hemorrhage, superior pole, pink disc edema. mild pink disc edema   C/D Ratio 0.1 0.1   Macula Normal Normal   Vessels no DR no DR   Periphery Normal lattice degeneration 3 meridian, no breaks          IMAGING AND PROCEDURES  Imaging and Procedures for 01/17/20  OCT, Retina - OU - Both Eyes       Right Eye Quality was good. Progression has no prior data. Findings include normal foveal contour.   Left Eye Quality was good. Scan locations included subfoveal. Progression  has no prior data. Findings include  normal foveal contour.   Notes NO Signs of pericapillary edema into the retina       Color Fundus Photography Optos - OU - Both Eyes       Right Eye Progression has no prior data. Macula : normal observations. Vessels : normal observations. Periphery : normal observations.   Left Eye Progression has no prior data. Disc findings include edema. Macula : normal observations. Vessels : normal observations. Periphery : normal observations.   Notes Diabetic retinopathy OU  Bilateral mild pink disc edema, hyperemic with small cup-to-disc ratios suggestive that this is nonarteritic anterior ischemic optic neuropathy       OCT, Optic Nerve - OU - Both Eyes       Right Eye Reliability was good.   Left Eye Reliability was good.   Notes Line scan pericapillary confirms presence of optic nerve congestion and edema which does not have subretinal fluid extending into the pericapillary space.                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  Also has a history of vasculopathy peripherally.  Although there is no diabetic retinopathy.  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  Patient tends to be self-limited tends to subside over the ensuing weeks to months.  Patient has a proper work-up including MR vascular study yesterday which was normal as well as MRI of the orbits and globes which showed disclose no evidence of optic nerve compression, normal extraocular muscles and no mass-effect.   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  The sed rate was reported as normal yet the patient does take meloxicam daily.  This can artifactually lower it the sed rate but he has no other stigmata of temporal arteritis.  I will simply reassure the patient that this condition is likely to be self-limited and not progressive.  If any visual acuity fluctuation or dimming or darkening of the vision occurs he should proceed emergently to the emergency room and tell them that he has swelling of the nerves and vision loss.     Posterior vitreous detachment of both eyes   The nature of posterior vitreous detachment was discussed with the patient as well as its physiology, its age prevalence, and its possible implication regarding retinal breaks and detachment.  An informational brochure was given to the patient.  All the patient's questions were answered.  The patient was asked to return if new or different flashes or floaters develops.   Patient was instructed to contact office immediately if any changes were noticed. I explained to the patient that vitreous inside the eye is similar to jello inside a bowl. As the jello melts it can start to pull away from the bowl, similarly the vitreous throughout our lives can begin to pull away from the retina. That process is called a posterior vitreous detachment. In some cases, the vitreous can tug hard enough on the retina to form a retinal tear. I discussed with the patient the signs and symptoms  of a retinal detachment.  Do not rub the eye.      ICD-10-CM   1. 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 Intermittent exotropia of both eyes  H50.10   3. Severe obstructive sleep apnea  G47.33    4. 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 Posterior vitreous detachment of both eyes  H43.813     1.  My opinion each eye is struck with nonarteritic anterior ischemic optic neuropathy with hyperemic disc edema mild amount.  Self-limited and tends to have little to no profound long-term vision loss.  Progression can occur over time  The patient and family understand the critical points of continue blood pressure control but also the use of CPAP for his obstructive sleep apnea.  He reports he is entirely compliant and would not go a night without it.  2.  The patient does have signs of steroid-induced glaucoma in the left eye for this reason I will asked him to immediately cease the use of Durezol topically in the left eye and follow-up with Dr.Bevis within 1 week  3.  Patient to continue other medications in the left eye as scheduled  4.  Follow-up with Dr. Talbert Forest as scheduled  Ophthalmic Meds Ordered this visit:  No orders of the defined types were placed in this encounter.      Return in about 1 week (around 01/24/2020) for COLOR FP, DILATE OU.  There are no Patient Instructions on file for this visit.   Explained the diagnoses, plan, and follow up with the patient and they expressed understanding.  Patient expressed understanding of the importance of proper follow up care.   Clent Demark Winola Drum M.D. Diseases & Surgery of the Retina and Vitreous Retina & Diabetic [place] 01/17/20     Abbreviations: M myopia (nearsighted); A astigmatism; H hyperopia (farsighted); P presbyopia; Mrx spectacle prescription;  CTL contact lenses; OD right eye; OS left eye; OU both eyes  XT exotropia; ET esotropia; PEK punctate epithelial keratitis; PEE punctate epithelial erosions; DES dry eye syndrome; MGD meibomian gland dysfunction; ATs artificial tears; PFAT's preservative free artificial tears; [place] nuclear sclerotic cataract; PSC posterior subcapsular cataract; ERM  epi-retinal membrane; PVD posterior vitreous detachment; RD retinal detachment; DM diabetes mellitus; DR diabetic retinopathy; NPDR non-proliferative diabetic retinopathy; PDR proliferative diabetic retinopathy; CSME clinically significant macular edema; DME diabetic macular edema; dbh dot blot hemorrhages; CWS cotton wool spot; POAG primary open angle glaucoma; C/D cup-to-disc ratio; HVF humphrey visual field; GVF goldmann visual field; OCT optical coherence tomography; IOP intraocular pressure; BRVO Branch retinal vein occlusion; CRVO central retinal vein occlusion; CRAO central retinal artery occlusion; BRAO branch retinal artery occlusion; RT retinal tear; SB scleral buckle; PPV pars plana vitrectomy; VH Vitreous hemorrhage; PRP panretinal laser photocoagulation; IVK intravitreal kenalog; VMT vitreomacular traction; MH Macular hole;  NVD neovascularization of the disc; NVE neovascularization elsewhere; AREDS age related eye disease study; ARMD age related macular degeneration; POAG primary open angle glaucoma; EBMD epithelial/anterior basement membrane dystrophy; ACIOL anterior chamber intraocular lens; IOL intraocular lens; PCIOL posterior chamber intraocular lens; Phaco/IOL phacoemulsification with intraocular lens placement; [place] photorefractive keratectomy; LASIK laser assisted in situ keratomileusis; HTN hypertension; DM diabetes mellitus; COPD chronic obstructive pulmonary disease

## 2020-01-17 NOTE — Assessment & Plan Note (Signed)
Patient confirms the use of nightly CPAP, I explained this is critical to preserve his optic nerve functioning given his predilection anatomically for nonarteritic anterior ischemic optic neuropathy

## 2020-01-17 NOTE — Assessment & Plan Note (Signed)

## 2020-01-25 ENCOUNTER — Ambulatory Visit (INDEPENDENT_AMBULATORY_CARE_PROVIDER_SITE_OTHER): Payer: PPO | Admitting: Ophthalmology

## 2020-01-25 ENCOUNTER — Other Ambulatory Visit: Payer: Self-pay

## 2020-01-25 ENCOUNTER — Encounter (INDEPENDENT_AMBULATORY_CARE_PROVIDER_SITE_OTHER): Payer: Self-pay | Admitting: Ophthalmology

## 2020-01-25 DIAGNOSIS — H47013 Ischemic optic neuropathy, bilateral: Secondary | ICD-10-CM

## 2020-01-25 NOTE — Progress Notes (Signed)
01/25/2020     CHIEF COMPLAINT Patient presents for Retina Follow Up   HISTORY OF PRESENT ILLNESS: Joshua Terrell is a 74 y.o. male who presents to the clinic today for:   HPI    Retina Follow Up    Patient presents with  Other.  In both eyes.  This started 1 week ago.  Severity is moderate.  Duration of 1 week.  Since onset it is stable.          Comments    1 Week F/U OU  Pt reports no improvement in [place] OU since last week. Pt c/o intermittent fb sensation OD "like an eyelash." No ocular pain OS.       Last edited by Rockie Neighbours, [place] on 01/25/2020 10:01 AM. (History)      Referring physician: [name], MD [place] [place],  [place] [address]  HISTORICAL INFORMATION:   Selected notes from the MEDICAL RECORD NUMBER       CURRENT MEDICATIONS: Current Outpatient Medications (Ophthalmic Drugs)  Medication Sig   brinzolamide (AZOPT) 1 % ophthalmic suspension Place 1 drop into the left eye at bedtime.   PROLENSA 0.07 % SOLN SMARTSIG:1 Drop(s) In Eye(s) Every Evening   BESIVANCE 0.6 % SUSP Place 1 drop into the left eye 3 (three) times daily.   DUREZOL 0.05 % EMUL Place 1 drop into the left eye 3 (three) times daily. (Patient not taking: Reported on 01/25/2020)   No current facility-administered medications for this visit. (Ophthalmic Drugs)   Current Outpatient Medications (Other)  Medication Sig   amiodarone (PACERONE) 200 MG tablet Take 0.5 tablets (100 mg total) by mouth daily.   apixaban (ELIQUIS) 5 MG TABS tablet Take 5 mg by mouth 2 (two) times daily.   atorvastatin (LIPITOR) 80 MG tablet Take 80 mg by mouth daily.    diltiazem (CARDIZEM CD) 180 MG 24 hr capsule Take 1 capsule (180 mg total) by mouth daily.   empagliflozin (JARDIANCE) 10 MG TABS tablet Take 10 mg by mouth daily.   insulin NPH Human (NOVOLIN N) 100 UNIT/ML injection Inject 40 units into the skin each morning and 45 units into the skin each evening.    lisinopril-hydrochlorothiazide (PRINZIDE,ZESTORETIC) 20-12.5 MG tablet Take 1 tablet by mouth daily.   meloxicam (MOBIC) 15 MG tablet Take 15 mg by mouth daily.   metFORMIN (GLUCOPHAGE-XR) 500 MG 24 hr tablet Take 2 tablets (1000 mg) by mouth twice daily   Multiple Vitamin (MULTIVITAMIN WITH MINERALS) TABS tablet Take 1 tablet by mouth daily.   neomycin-polymyxin-hydrocortisone (CORTISPORIN) OTIC solution Apply 1-2 drops to toe after soaking twice a day   nitroGLYCERIN (NITROSTAT) 0.4 MG SL tablet Place 0.4 mg under the tongue every 5 (five) minutes as needed for chest pain.   oxyCODONE-acetaminophen (PERCOCET/ROXICET) 5-325 MG tablet Take 1 tablet by mouth at bedtime as needed (pain).    TRULICITY 1.5 KV/4.2VZ SOPN Inject 1.5 mg into the skin every Sunday.   No current facility-administered medications for this visit. (Other)      REVIEW OF SYSTEMS:    ALLERGIES No Known Allergies  PAST MEDICAL HISTORY Past Medical History:  Diagnosis Date   Coronary artery disease    PTCA of the codominant LAD, 1992, CABG July 94   Diabetes mellitus without complication ([place])    Diverticulosis    Erectile dysfunction    HTN (hypertension)    Hyperlipidemia    Myocardial infarction ([place])    Obesity  OSA (obstructive sleep apnea)    Tinnitus    Past Surgical History:  Procedure Laterality Date   CORONARY ANGIOPLASTY     CORONARY ARTERY BYPASS GRAFT     VASECTOMY      FAMILY HISTORY Family History  Problem Relation Age of Onset   Healthy Mother    Heart attack Father    Heart disease Father    Healthy Sister    Other Brother        CAR ACCIDENT   Healthy Sister     SOCIAL HISTORY Social History   Tobacco Use   Smoking status: Former Smoker   Smokeless tobacco: Never Used  Scientific laboratory technician Use: Never used  Substance Use Topics   Alcohol use: No    Alcohol/week: 0.0 standard drinks   Drug use: No         OPHTHALMIC EXAM:  Base  Eye Exam    Visual Acuity (ETDRS)      Right Left   Dist [place] 20/40 -2 20/20 -2   Dist ph [place] 20/30 -2        Tonometry (Tonopen, 10:04 AM)      Right Left   Pressure 11 20       Pupils      Pupils Dark Light Shape React APD   Right PERRL 4 3 Round Brisk None   Left PERRL 4 3 Round Brisk None       Visual Fields (Counting fingers)      Left Right    Full Full       Extraocular Movement   OD XT upward gaze       Neuro/Psych    Oriented x3: Yes   Mood/Affect: Normal       Dilation    Both eyes: 1.0% Mydriacyl, 2.5% Phenylephrine @ 10:06 AM        Slit Lamp and Fundus Exam    External Exam      Right Left   External Normal Normal       Slit Lamp Exam      Right Left   Lids/Lashes Normal Normal   Conjunctiva/Sclera White and quiet White and quiet   Cornea Clear Clear   Anterior Chamber Deep and quiet Deep and quiet   Iris Round and reactive Round and reactive   Lens Posterior chamber intraocular lens Posterior chamber intraocular lens   Anterior Vitreous Normal Normal       Fundus Exam      Right Left   Posterior Vitreous Posterior vitreous detachment Posterior vitreous detachment   Disc small peripapillary deep hemorrhage, superior pole, pink disc edema.,  With less edema of the nerve mild pink disc edema, less edema of the nerve   C/D Ratio 0.1 0.1   Macula Normal Normal   Vessels no DR no DR   Periphery Normal lattice degeneration 3 meridian, no breaks          IMAGING AND PROCEDURES  Imaging and Procedures for 01/25/20  Color Fundus Photography Optos - OU - Both Eyes       Right Eye Progression has no prior data. Macula : normal observations. Vessels : normal observations. Periphery : normal observations.   Left Eye Progression has no prior data. Disc findings include edema. Macula : normal observations. Vessels : normal observations. Periphery : normal observations.   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      ICD-10-CM   1. 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  2.  3.  Ophthalmic Meds Ordered this visit:  No orders of the defined types were placed in this encounter.      Return if symptoms worsen or fail to improve,Dr. Talbert Forest, for COLOR FP, DILATE OU.  Patient Instructions  All up with Dr. Audry Pili as scheduled in the first or second week of September 2021  Patient to seek out prompt medical attention via the emergency room should new vision decline develop darkness develops in one or the other eye.    Explained the diagnoses, plan, and follow up with the patient and they expressed understanding.  Patient expressed understanding of the importance of proper follow up care.   Clent Demark Darly Fails M.D. Diseases & Surgery of the Retina and Vitreous Retina & Diabetic [place] 01/25/20     Abbreviations: M myopia (nearsighted); A astigmatism; H hyperopia (farsighted); P presbyopia; Mrx spectacle prescription;  CTL contact lenses; OD right eye; OS left eye; OU both eyes  XT exotropia; ET esotropia; PEK punctate epithelial keratitis; PEE punctate epithelial erosions; DES dry eye syndrome; MGD meibomian gland dysfunction; ATs artificial tears; PFAT's preservative free artificial tears; [place] nuclear sclerotic cataract; PSC posterior subcapsular cataract; ERM epi-retinal membrane; PVD posterior vitreous detachment; RD retinal detachment; DM diabetes mellitus; DR diabetic retinopathy; NPDR non-proliferative  diabetic retinopathy; PDR proliferative diabetic retinopathy; CSME clinically significant macular edema; DME diabetic macular edema; dbh dot blot hemorrhages; CWS cotton wool spot; POAG primary open angle glaucoma; C/D cup-to-disc ratio; HVF humphrey visual field; GVF goldmann visual field; OCT optical coherence tomography; IOP intraocular pressure; BRVO Branch retinal vein occlusion; CRVO central retinal vein occlusion; CRAO central retinal artery occlusion; BRAO branch retinal artery occlusion; RT retinal tear; SB scleral buckle; PPV pars plana vitrectomy; VH Vitreous hemorrhage; PRP panretinal laser photocoagulation; IVK intravitreal kenalog; VMT vitreomacular traction; MH Macular hole;  NVD neovascularization of the disc; NVE neovascularization elsewhere; AREDS age related eye disease study; ARMD age related macular degeneration; POAG primary open angle glaucoma; EBMD epithelial/anterior basement membrane dystrophy; ACIOL anterior chamber intraocular lens; IOL intraocular lens; PCIOL posterior chamber intraocular lens; Phaco/IOL phacoemulsification with intraocular lens placement; [place] photorefractive keratectomy; LASIK laser assisted in situ keratomileusis; HTN hypertension; DM diabetes mellitus; COPD chronic obstructive pulmonary disease

## 2020-01-25 NOTE — Patient Instructions (Addendum)
All up with Dr. Audry Pili as scheduled in the first or second week of September 2021  Patient to seek out prompt medical attention via the emergency room should new vision decline develop darkness develops in one or the other eye.

## 2020-01-25 NOTE — Assessment & Plan Note (Signed)
Bilateral anterior ischemic optic neuropathy, stable overall, and less edema and most importantly no progression of findings.

## 2020-02-20 DIAGNOSIS — E1169 Type 2 diabetes mellitus with other specified complication: Secondary | ICD-10-CM | POA: Diagnosis not present

## 2020-02-20 DIAGNOSIS — I480 Paroxysmal atrial fibrillation: Secondary | ICD-10-CM | POA: Diagnosis not present

## 2020-02-20 DIAGNOSIS — I2510 Atherosclerotic heart disease of native coronary artery without angina pectoris: Secondary | ICD-10-CM | POA: Diagnosis not present

## 2020-02-20 DIAGNOSIS — M169 Osteoarthritis of hip, unspecified: Secondary | ICD-10-CM | POA: Diagnosis not present

## 2020-02-20 DIAGNOSIS — E7800 Pure hypercholesterolemia, unspecified: Secondary | ICD-10-CM | POA: Diagnosis not present

## 2020-02-20 DIAGNOSIS — I10 Essential (primary) hypertension: Secondary | ICD-10-CM | POA: Diagnosis not present

## 2020-03-07 ENCOUNTER — Other Ambulatory Visit: Payer: Self-pay | Admitting: Orthopedic Surgery

## 2020-03-07 ENCOUNTER — Other Ambulatory Visit: Payer: Self-pay | Admitting: Interventional Cardiology

## 2020-03-07 DIAGNOSIS — M545 Low back pain, unspecified: Secondary | ICD-10-CM | POA: Diagnosis not present

## 2020-03-18 DIAGNOSIS — E1169 Type 2 diabetes mellitus with other specified complication: Secondary | ICD-10-CM | POA: Diagnosis not present

## 2020-03-18 DIAGNOSIS — I10 Essential (primary) hypertension: Secondary | ICD-10-CM | POA: Diagnosis not present

## 2020-03-18 DIAGNOSIS — Z23 Encounter for immunization: Secondary | ICD-10-CM | POA: Diagnosis not present

## 2020-03-18 DIAGNOSIS — G8929 Other chronic pain: Secondary | ICD-10-CM | POA: Diagnosis not present

## 2020-03-18 DIAGNOSIS — M545 Low back pain, unspecified: Secondary | ICD-10-CM | POA: Diagnosis not present

## 2020-03-18 DIAGNOSIS — R5383 Other fatigue: Secondary | ICD-10-CM | POA: Diagnosis not present

## 2020-03-25 DIAGNOSIS — M25571 Pain in right ankle and joints of right foot: Secondary | ICD-10-CM | POA: Diagnosis not present

## 2020-04-02 ENCOUNTER — Ambulatory Visit
Admission: RE | Admit: 2020-04-02 | Discharge: 2020-04-02 | Disposition: A | Discharge status: Home / Self Care | Payer: PPO | Source: Ambulatory Visit | Attending: Orthopedic Surgery | Admitting: Orthopedic Surgery

## 2020-04-02 ENCOUNTER — Other Ambulatory Visit: Payer: Self-pay

## 2020-04-02 DIAGNOSIS — M48061 Spinal stenosis, lumbar region without neurogenic claudication: Secondary | ICD-10-CM | POA: Diagnosis not present

## 2020-04-02 DIAGNOSIS — M545 Low back pain, unspecified: Secondary | ICD-10-CM | POA: Diagnosis not present

## 2020-04-09 DIAGNOSIS — M4800 Spinal stenosis, site unspecified: Secondary | ICD-10-CM | POA: Diagnosis not present

## 2020-04-15 DIAGNOSIS — M47816 Spondylosis without myelopathy or radiculopathy, lumbar region: Secondary | ICD-10-CM | POA: Diagnosis not present

## 2020-04-20 ENCOUNTER — Other Ambulatory Visit: Payer: Self-pay | Admitting: Cardiology

## 2020-04-29 DIAGNOSIS — I10 Essential (primary) hypertension: Secondary | ICD-10-CM | POA: Diagnosis not present

## 2020-04-29 DIAGNOSIS — E7800 Pure hypercholesterolemia, unspecified: Secondary | ICD-10-CM | POA: Diagnosis not present

## 2020-04-29 DIAGNOSIS — G8929 Other chronic pain: Secondary | ICD-10-CM | POA: Diagnosis not present

## 2020-04-29 DIAGNOSIS — I480 Paroxysmal atrial fibrillation: Secondary | ICD-10-CM | POA: Diagnosis not present

## 2020-04-29 DIAGNOSIS — M169 Osteoarthritis of hip, unspecified: Secondary | ICD-10-CM | POA: Diagnosis not present

## 2020-04-29 DIAGNOSIS — E1169 Type 2 diabetes mellitus with other specified complication: Secondary | ICD-10-CM | POA: Diagnosis not present

## 2020-04-29 DIAGNOSIS — I2510 Atherosclerotic heart disease of native coronary artery without angina pectoris: Secondary | ICD-10-CM | POA: Diagnosis not present

## 2020-05-02 DIAGNOSIS — M47816 Spondylosis without myelopathy or radiculopathy, lumbar region: Secondary | ICD-10-CM | POA: Diagnosis not present

## 2020-05-13 DIAGNOSIS — G4733 Obstructive sleep apnea (adult) (pediatric): Secondary | ICD-10-CM | POA: Diagnosis not present

## 2020-05-13 NOTE — Progress Notes (Signed)
Cardiology Office Note:    Date:  05/15/2020   ID:  Joshua Terrell, DOB 02/20/1946, MRN [id number]  PCP:  Joshua Orn, MD  Cardiologist:  Joshua Grooms, MD   Referring MD: Joshua Orn, MD   Chief Complaint  Patient presents with  . Atrial Fibrillation  . Coronary Artery Disease  . Congestive Heart Failure    History of Present Illness:    Joshua Terrell is a 74 y.o. male with a hx of CAD with CABG 1994 including SVG to OM, SVG to RCA, and SVG to diagonal. 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  6 to 77-month history of exertion related shortness of breath and upper chest tightness.  The tightness feels similar to what he had prior to stenting.  Very short time of rest resolves the complaints.  He has had no symptoms at rest.  He denies orthopnea and PND.  He is Joshua try to use nitroglycerin or had duration of discomfort that required nitro.  He has Joshua had fluttering or palpitations in the chest.  No syncopal episodes.  No bleeding on Eliquis.  Transient neurological symptoms.  He has Joshua had fast heart rates.  Past Medical History:  Diagnosis Date  . Coronary artery disease    PTCA of the codominant LAD, 1992, CABG July 94  . Diabetes mellitus without complication ([place])   . Diverticulosis   . Erectile dysfunction   . HTN (hypertension)   . Hyperlipidemia   . Myocardial infarction ([place])   . Obesity   . OSA (obstructive sleep apnea)   . Tinnitus     Past Surgical History:  Procedure Laterality Date  . CORONARY ANGIOPLASTY    . CORONARY ARTERY BYPASS GRAFT    . VASECTOMY      Current Medications: Current Meds  Medication Sig  . amiodarone (PACERONE) 200 MG tablet Take 100 mg by mouth daily.  Marland Kitchen apixaban (ELIQUIS) 5 MG TABS tablet Take 5 mg by mouth 2 (two) times daily.  Marland Kitchen atorvastatin  (LIPITOR) 80 MG tablet Take 80 mg by mouth daily.   Marland Kitchen diltiazem (CARDIZEM CD) 180 MG 24 hr capsule TAKE 1 CAPSULE BY MOUTH DAILY  . empagliflozin (JARDIANCE) 10 MG TABS tablet Take 10 mg by mouth daily.  . insulin NPH Human (NOVOLIN N) 100 UNIT/ML injection Inject 40 units into the skin each morning and 45 units into the skin each evening.  Marland Kitchen lisinopril-hydrochlorothiazide (PRINZIDE,ZESTORETIC) 20-12.5 MG tablet Take 1 tablet by mouth daily.  . meloxicam (MOBIC) 15 MG tablet Take 15 mg by mouth daily.  . metFORMIN (GLUCOPHAGE-XR) 500 MG 24 hr tablet Take 2 tablets (1000 mg) by mouth twice daily  . Multiple Vitamin (MULTIVITAMIN WITH MINERALS) TABS tablet Take 1 tablet by mouth daily.  . nitroGLYCERIN (NITROSTAT) 0.4 MG SL tablet Place 0.4 mg under the tongue every 5 (five) minutes as needed for chest pain.  Marland Kitchen oxyCODONE-acetaminophen (PERCOCET/ROXICET) 5-325 MG tablet Take 1 tablet by mouth at bedtime as needed (pain).   . TRULICITY 1.5 MW/4.1LK SOPN Inject 1.5 mg into the skin every Sunday.     Allergies:   Patient has no known allergies.   Social History   Socioeconomic History  . Marital status: Married    Spouse name: Joshua Terrell  . Number of children: Joshua Terrell  .  Years of education: Joshua Terrell  . Highest education level: Joshua Terrell  Occupational History  . Joshua Terrell  Tobacco Use  . Smoking status: Former Research scientist (life sciences)  . Smokeless tobacco: Never Used  Vaping Use  . Vaping Use: Never used  Substance and Sexual Activity  . Alcohol use: No    Alcohol/week: 0.0 standard drinks  . Drug use: No  . Sexual activity: Joshua Terrell  Other Topics Concern  . Joshua Terrell  Social History Narrative  . Joshua Terrell   Social Determinants of Health   Financial Resource Strain: Joshua Terrell  Food Insecurity: Joshua Terrell  Transportation Needs: Joshua Terrell  Physical Activity: Joshua Terrell  Stress: Joshua Terrell  Social Connections: Joshua Terrell     Family History: The patient's family  history includes Healthy in his mother, sister, and sister; Heart attack in his father; Heart disease in his father; Other in his brother.  ROS:   Please see the history of present illness.    Significant diminished vision in his right eye.  He overexerted himself after cataract surgery on the right eye and now suffers significant vision loss due to the development of glaucoma that was undetected.  All other systems reviewed and are negative.  EKGs/Labs/Other Studies Reviewed:    The following studies were reviewed today: No recent ischemic evaluation  EKG:  EKG Joshua performed.  Last EKG demonstrated atrial abnormality, first-degree AV block, sinus rhythm,.  Recent Labs: 01/16/2020: BUN 24; Creatinine, Ser 1.20; Hemoglobin 13.9; Platelets 263; Potassium 4.1; Sodium 145  Recent Lipid Panel No results found for: CHOL, TRIG, HDL, CHOLHDL, VLDL, LDLCALC, LDLDIRECT  Physical Exam:    VS:  BP 120/60   Pulse 71   Ht 6' (1.829 m)   Wt 254 lb (115.2 kg)   SpO2 97%   BMI 34.45 kg/m     Wt Readings from Last 3 Encounters:  05/15/20 254 lb (115.2 kg)  01/16/20 260 lb (117.9 kg)  10/02/19 269 lb 3.2 oz (122.1 kg)     GEN: Obese. No acute distress HEENT: Normal NECK: No JVD. LYMPHATICS: No lymphadenopathy CARDIAC: 2/6 right upper sternal systolic murmur. RRR without gallop, or edema. VASCULAR:  Normal Pulses. No bruits. RESPIRATORY:  Clear to auscultation without rales, wheezing or rhonchi  ABDOMEN: Soft, non-tender, non-distended, No pulsatile mass, MUSCULOSKELETAL: No deformity  SKIN: Warm and dry NEUROLOGIC:  Alert and oriented x 3 PSYCHIATRIC:  Normal affect   ASSESSMENT:    1. Coronary artery disease of bypass graft of native heart with stable angina pectoris ([name])   2. Chronic diastolic heart failure ([name])   3. On amiodarone therapy   4. Atypical atrial flutter ([name])   5. OSA (obstructive sleep apnea)   6. Essential hypertension, benign   7. Chronic anticoagulation   8.  Educated about COVID-19 virus infection    PLAN:    In order of problems listed above:  1. Myocardial perfusion imaging.  Nitroglycerin if chest pain.  Further evaluation depending upon imaging.  Will need a 4-week follow-up and may need to consider angiography depending upon findings.  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  I am most interested in being certain that LAD ischemia is Joshua present. 2. Continue Jardiance, and depending upon EF, diuretic may be helpful.  Diltiazem is being used however if EF is decreasing, beta-blockers will be a better choice.  I believe diltiazem was started  after he developed atrial flutter. 3. He is taking amiodarone 100 mg/day.  He is in rhythm today 4. Continue amiodarone for a flutter suppression. 5. Encouraged the use of CPAP. 6. Excellent current blood pressure control.  Consider adding a diuretic if EF is low.   7. Continue Eliquis 5 mg twice daily 8. Vaccinated and Audiological scientist.  Stress myocardial perfusion imaging with regadenoson.  Reiterated the importance of secondary prevention as outlined:Overall education and awareness concerning secondary risk prevention was discussed in detail: LDL less than 70, hemoglobin A1c less than 7, blood pressure target less than 130/80 mmHg, >150 minutes of moderate aerobic activity per week, avoidance of smoking, weight control (via diet and exercise), and continued surveillance/management of/for obstructive sleep apnea.   Medication Adjustments/Labs and Tests Ordered: Current medicines are reviewed at length with the patient today.  Concerns regarding medicines are outlined above.  No orders of the defined types were placed in this encounter.  No orders of the defined types were placed in this encounter.   There are no Patient Instructions on Ter[place] for this visit.   Signed, Joshua Grooms, MD  05/15/2020 4:04 PM    [facility]

## 2020-05-14 DIAGNOSIS — H02831 Dermatochalasis of right upper eyelid: Secondary | ICD-10-CM | POA: Diagnosis not present

## 2020-05-14 DIAGNOSIS — Z961 Presence of intraocular lens: Secondary | ICD-10-CM | POA: Diagnosis not present

## 2020-05-14 DIAGNOSIS — H18413 Arcus senilis, bilateral: Secondary | ICD-10-CM | POA: Diagnosis not present

## 2020-05-14 DIAGNOSIS — H4710 Unspecified papilledema: Secondary | ICD-10-CM | POA: Diagnosis not present

## 2020-05-15 ENCOUNTER — Ambulatory Visit: Payer: PPO | Admitting: Interventional Cardiology

## 2020-05-15 ENCOUNTER — Other Ambulatory Visit: Payer: Self-pay

## 2020-05-15 ENCOUNTER — Encounter: Payer: Self-pay | Admitting: *Deleted

## 2020-05-15 ENCOUNTER — Encounter: Payer: Self-pay | Admitting: Interventional Cardiology

## 2020-05-15 VITALS — BP 120/60 | HR 71 | Ht 72.0 in | Wt 254.0 lb

## 2020-05-15 DIAGNOSIS — I484 Atypical atrial flutter: Secondary | ICD-10-CM | POA: Diagnosis not present

## 2020-05-15 DIAGNOSIS — Z79899 Other long term (current) drug therapy: Secondary | ICD-10-CM

## 2020-05-15 DIAGNOSIS — I10 Essential (primary) hypertension: Secondary | ICD-10-CM

## 2020-05-15 DIAGNOSIS — G4733 Obstructive sleep apnea (adult) (pediatric): Secondary | ICD-10-CM

## 2020-05-15 DIAGNOSIS — I25708 Atherosclerosis of coronary artery bypass graft(s), unspecified, with other forms of angina pectoris: Secondary | ICD-10-CM

## 2020-05-15 DIAGNOSIS — Z7901 Long term (current) use of anticoagulants: Secondary | ICD-10-CM

## 2020-05-15 DIAGNOSIS — Z7189 Other specified counseling: Secondary | ICD-10-CM | POA: Diagnosis not present

## 2020-05-15 DIAGNOSIS — I5032 Chronic diastolic (congestive) heart failure: Secondary | ICD-10-CM

## 2020-05-15 MED ORDER — NITROGLYCERIN 0.4 MG SL SUBL: 0.4000 mg | SUBLINGUAL_TABLET | SUBLINGUAL | 3 refills | Status: DC | PRN

## 2020-05-15 NOTE — Patient Instructions (Addendum)
Medication Instructions:   A prescription has been sent in for Nitroglycerin.  If you have chest pain that doesn't relieve quickly, place one tablet under your tongue and allow it to dissolve.  If no relief after 5 minutes, you may take another pill.  If no relief after 5 minutes, you may take a 3rd dose but you need to call 911 and report to ER immediately.  *If you need a refill on your cardiac medications before your next appointment, please call your pharmacy*   Lab Work: None If you have labs (blood work) drawn today and your tests are completely normal, you will receive your results only by: Marland Kitchen MyChart Message (if you have MyChart) OR . A paper copy in the mail If you have any lab test that is abnormal or we need to change your treatment, we will call you to review the results.   Testing/Procedures: Your physician has requested that you have a lexiscan myoview. For further information please visit [email or web address]. Please follow instruction sheet, as given.   Follow-Up: At [facility], you and your health needs are our priority.  As part of our continuing mission to provide you with exceptional heart care, we have created designated Provider Care Teams.  These Care Teams include your primary Cardiologist (physician) and Advanced Practice Providers (APPs -  Physician Assistants and Nurse Practitioners) who all work together to provide you with the care you need, when you need it.  We recommend signing up for the patient portal called "MyChart".  Sign up information is provided on this After Visit Summary.  MyChart is used to connect with patients for Virtual Visits (Telemedicine).  Patients are able to view lab/test results, encounter notes, upcoming appointments, etc.  Non-urgent messages can be sent to your provider as well.   To learn more about what you can do with MyChart, go to [email or web address].    Your next appointment:   1 month(s)- can have January 26th at 1:40pm or  2pm  The format for your next appointment:   In Person  Provider:   You may see [name], MD or one of the following Advanced Practice Providers on your designated Care Team:    Truitt Merle, NP  Cecilie Kicks, NP  Kathyrn Drown, NP    Other Instructions

## 2020-05-22 ENCOUNTER — Telehealth (HOSPITAL_COMMUNITY): Payer: Self-pay | Admitting: *Deleted

## 2020-05-22 NOTE — Telephone Encounter (Signed)
Left message on voicemail per DPR in reference to upcoming appointment scheduled on 05/27/20 at 7:45 with detailed instructions given per Myocardial Perfusion Study Information Sheet for the test. LM to arrive 15 minutes early, and that it is imperative to arrive on time for appointment to keep from having the test rescheduled. If you need to cancel or reschedule your appointment, please call the office within 24 hours of your appointment. Failure to do so may result in a cancellation of your appointment, and a $50 no show fee. Phone number given for call back for any questions.

## 2020-05-27 ENCOUNTER — Other Ambulatory Visit: Payer: Self-pay

## 2020-05-27 ENCOUNTER — Ambulatory Visit (HOSPITAL_COMMUNITY): Payer: PPO | Attending: Cardiology

## 2020-05-27 DIAGNOSIS — I25708 Atherosclerosis of coronary artery bypass graft(s), unspecified, with other forms of angina pectoris: Secondary | ICD-10-CM | POA: Insufficient documentation

## 2020-05-27 LAB — MYOCARDIAL PERFUSION IMAGING
LV dias vol: 91 mL (ref 62–150)
LV sys vol: 34 mL
Peak HR: 90 {beats}/min
Rest HR: 64 {beats}/min
SDS: 1
SRS: 0
SSS: 1
TID: 0.97

## 2020-05-27 MED ORDER — TECHNETIUM TC 99M TETROFOSMIN IV KIT
30.5000 | PACK | Freq: Once | INTRAVENOUS | Status: AC | PRN
  Administered 2020-05-27: 30.5 via INTRAVENOUS
  Filled 2020-05-27: qty 31

## 2020-05-27 MED ORDER — TECHNETIUM TC 99M TETROFOSMIN IV KIT
10.9000 | PACK | Freq: Once | INTRAVENOUS | Status: AC | PRN
  Administered 2020-05-27: 10.9 via INTRAVENOUS
  Filled 2020-05-27: qty 11

## 2020-05-27 MED ORDER — REGADENOSON 0.4 MG/5ML IV SOLN
0.4000 mg | Freq: Once | INTRAVENOUS | Status: AC
  Administered 2020-05-27: 0.4 mg via INTRAVENOUS

## 2020-05-29 DIAGNOSIS — I10 Essential (primary) hypertension: Secondary | ICD-10-CM | POA: Diagnosis not present

## 2020-05-29 DIAGNOSIS — I2510 Atherosclerotic heart disease of native coronary artery without angina pectoris: Secondary | ICD-10-CM | POA: Diagnosis not present

## 2020-05-29 DIAGNOSIS — M169 Osteoarthritis of hip, unspecified: Secondary | ICD-10-CM | POA: Diagnosis not present

## 2020-05-29 DIAGNOSIS — E1169 Type 2 diabetes mellitus with other specified complication: Secondary | ICD-10-CM | POA: Diagnosis not present

## 2020-05-29 DIAGNOSIS — G8929 Other chronic pain: Secondary | ICD-10-CM | POA: Diagnosis not present

## 2020-05-29 DIAGNOSIS — E7800 Pure hypercholesterolemia, unspecified: Secondary | ICD-10-CM | POA: Diagnosis not present

## 2020-05-29 DIAGNOSIS — E1165 Type 2 diabetes mellitus with hyperglycemia: Secondary | ICD-10-CM | POA: Diagnosis not present

## 2020-05-29 DIAGNOSIS — I480 Paroxysmal atrial fibrillation: Secondary | ICD-10-CM | POA: Diagnosis not present

## 2020-06-03 ENCOUNTER — Other Ambulatory Visit: Payer: Self-pay | Admitting: *Deleted

## 2020-06-03 ENCOUNTER — Telehealth: Payer: Self-pay

## 2020-06-03 DIAGNOSIS — M47816 Spondylosis without myelopathy or radiculopathy, lumbar region: Secondary | ICD-10-CM | POA: Diagnosis not present

## 2020-06-03 MED ORDER — ISOSORBIDE MONONITRATE ER 30 MG PO TB24
30.0000 mg | ORAL_TABLET | Freq: Every day | ORAL | 3 refills | Status: DC
Start: 2020-06-03 — End: 2020-06-26

## 2020-06-03 NOTE — Telephone Encounter (Signed)
Was in chart looking for who called, patient was returning Jennifer's call for results. Connected call to [facility]

## 2020-06-05 ENCOUNTER — Telehealth: Payer: Self-pay | Admitting: Interventional Cardiology

## 2020-06-05 NOTE — Telephone Encounter (Signed)
Pt c/o medication issue:  1. Name of Medication: isosorbide mononitrate (IMDUR) 30 MG 24 hr tablet    2. How are you currently taking this medication (dosage and times per day)? Patients wife states that he's been taking 2 tablets a day.  3. Are you having a reaction (difficulty breathing--STAT)? Dizziness.  4. What is your medication issue? Patients wife states that she thinks that he took too much of this medication. Please advise.

## 2020-06-05 NOTE — Telephone Encounter (Signed)
Spoke with wife and clarified that he has been taking 2 of his Imdur tablets.  Advised wife he is only suppose to be taking one and then if he felt ok and blood pressure wasn't too low, he was suppose to start taking 60mg  (2 tablets) after a week.  Advised wife to have him decrease down to one tablet to allow his body to adjust to the medication and then if feeling ok after a week, can try going to two tablets.  Wife verbalized understanding and was appreciative for call.

## 2020-06-10 ENCOUNTER — Telehealth: Payer: Self-pay | Admitting: Interventional Cardiology

## 2020-06-10 NOTE — Telephone Encounter (Signed)
After going back to one tablet of Imdur, pt did much better.  Wife calling to see if ok to try to the 2 tablets now.  Advised ok to try to titrate up now that his body has adjusted to the one tablet.  Advised if dizziness or other symptoms occur, back off to one tablet and call and let me know.  Advised if dose tolerated, call me in a week and let me know so I can send in the 60mg  tablets.  Wife verbalized understanding and was in agreement with this plan.

## 2020-06-10 NOTE — Telephone Encounter (Signed)
Patients wife is calling to speak to [place] with follow up questions from their call on 06/07/2020. Please advise.

## 2020-06-18 DIAGNOSIS — M47816 Spondylosis without myelopathy or radiculopathy, lumbar region: Secondary | ICD-10-CM | POA: Diagnosis not present

## 2020-06-25 NOTE — Progress Notes (Signed)
Cardiology Office Note:    Date:  06/26/2020   ID:  Joshua Terrell, DOB 1946-01-26, MRN [id number]  PCP:  [name], MD  Cardiologist:  [name], MD   Referring MD: [name], MD   Chief Complaint  Patient presents with  . Atrial Fibrillation  . Coronary Artery Disease    History of Present Illness:    Joshua Terrell is a 75 y.o. male with a hx of CAD with CABG 1994 including SVG to OM, SVG to RCA, and SVG to diagonal. 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  When last seen, symptoms gave the impression that increasing angina was occurring.  Isosorbide mononitrate was started.  Since that time he has felt weak and tired.  When he initially started isosorbide he took 30 mg twice a day and felt terrible.  Now on 30 mg once per day he still feels more washed out than usual.  He still has the occasional chest discomfort very similar to what he has had over the years.  In retrospect, his complaints about chest discomfort were rediscussed today.  He states that he may have over played the symptoms.  The discomfort that he spoke about has been occurring intermittently for years.  He will get it 2-4 times per month.  It is not brought on by physical activity.  Past Medical History:  Diagnosis Date  . Coronary artery disease    PTCA of the codominant LAD, 1992, CABG July 94  . Diabetes mellitus without complication ([place])   . Diverticulosis   . Erectile dysfunction   . HTN (hypertension)   . Hyperlipidemia   . Myocardial infarction ([place])   . Obesity   . OSA (obstructive sleep apnea)   . Tinnitus     Past Surgical History:  Procedure Laterality Date  . CORONARY ANGIOPLASTY    . CORONARY ARTERY BYPASS GRAFT    . VASECTOMY      Current Medications: Current Meds  Medication Sig  . amiodarone  (PACERONE) 200 MG tablet Take 100 mg by mouth daily.  Marland Kitchen apixaban (ELIQUIS) 5 MG TABS tablet Take 5 mg by mouth 2 (two) times daily.  Marland Kitchen atorvastatin (LIPITOR) 80 MG tablet Take 80 mg by mouth daily.   Marland Kitchen diltiazem (CARDIZEM CD) 180 MG 24 hr capsule TAKE 1 CAPSULE BY MOUTH DAILY  . empagliflozin (JARDIANCE) 10 MG TABS tablet Take 10 mg by mouth daily.  . insulin NPH Human (NOVOLIN N) 100 UNIT/ML injection Inject 40 units into the skin each morning and 45 units into the skin each evening.  Marland Kitchen lisinopril-hydrochlorothiazide (PRINZIDE,ZESTORETIC) 20-12.5 MG tablet Take 1 tablet by mouth daily.  . meloxicam (MOBIC) 15 MG tablet Take 15 mg by mouth daily.  . metFORMIN (GLUCOPHAGE-XR) 500 MG 24 hr tablet Take 2 tablets (1000 mg) by mouth twice daily  . Multiple Vitamin (MULTIVITAMIN WITH MINERALS) TABS tablet Take 1 tablet by mouth daily.  . nitroGLYCERIN (NITROSTAT) 0.4 MG SL tablet Place 1 tablet (0.4 mg total) under the tongue every 5 (five) minutes as needed for chest pain.  Marland Kitchen oxyCODONE-acetaminophen (PERCOCET/ROXICET) 5-325 MG tablet Take 1 tablet by mouth at bedtime as needed (pain).   . TRULICITY 1.5 MP/5.3IR SOPN Inject 1.5 mg into the skin every Sunday.  . [DISCONTINUED] isosorbide mononitrate (IMDUR) 30 MG 24 hr tablet Take  1 tablet (30 mg total) by mouth daily.     Allergies:   Patient has no known allergies.   Social History   Socioeconomic History  . Marital status: Married    Spouse name: Not on file  . Number of children: Not on file  . Years of education: Not on file  . Highest education level: Not on file  Occupational History  . Not on file  Tobacco Use  . Smoking status: Former Research scientist (life sciences)  . Smokeless tobacco: Never Used  Vaping Use  . Vaping Use: Never used  Substance and Sexual Activity  . Alcohol use: No    Alcohol/week: 0.0 standard drinks  . Drug use: No  . Sexual activity: Not on file  Other Topics Concern  . Not on file  Social History Narrative  . Not on file    Social Determinants of Health   Financial Resource Strain: Not on file  Food Insecurity: Not on file  Transportation Needs: Not on file  Physical Activity: Not on file  Stress: Not on file  Social Connections: Not on file     Family History: The patient's family history includes Healthy in his mother, sister, and sister; Heart attack in his father; Heart disease in his father; Other in his brother.  ROS:   Please see the history of present illness.    Not feeling quite as well as he did before starting isosorbide.  Otherwise no complaints.  All other systems reviewed and are negative.  EKGs/Labs/Other Studies Reviewed:    The following studies were reviewed today:  Liver function test, TSH, or both normal in October.  NUCLEAR STRESS TEST 05/27/2020: Study Highlights    Nuclear stress EF: 63%. No wall motion abnormalities  There was no ST segment deviation noted during stress.  Defect 1: There is a small defect of mild severity present in the apex location.  This is a low risk study. No ischemia identified.    EKG:  EKG not repeated  Recent Labs: 01/16/2020: BUN 24; Creatinine, Ser 1.20; Hemoglobin 13.9; Platelets 263; Potassium 4.1; Sodium 145  Recent Lipid Panel No results found for: CHOL, TRIG, HDL, CHOLHDL, VLDL, LDLCALC, LDLDIRECT  Physical Exam:    VS:  BP (!) 90/50 (BP Location: Left Arm, Patient Position: Sitting, Cuff Size: Large)   Pulse 85   Ht 6' (1.829 m)   Wt 257 lb (116.6 kg)   SpO2 96%   BMI 34.86 kg/m     Wt Readings from Last 3 Encounters:  06/26/20 257 lb (116.6 kg)  05/27/20 254 lb (115.2 kg)  05/15/20 254 lb (115.2 kg)     GEN: Obese. No acute distress HEENT: Normal NECK: No JVD. LYMPHATICS: No lymphadenopathy CARDIAC: No murmur. RRR no gallop, or edema. VASCULAR:  Normal Pulses. No bruits. RESPIRATORY:  Clear to auscultation without rales, wheezing or rhonchi  ABDOMEN: Soft, non-tender, non-distended, No pulsatile  mass, MUSCULOSKELETAL: No deformity  SKIN: Warm and dry NEUROLOGIC:  Alert and oriented x 3 PSYCHIATRIC:  Normal affect   ASSESSMENT:    1. Coronary artery disease of bypass graft of native heart with stable angina pectoris ([place])   2. Chronic diastolic heart failure ([place])   3. On amiodarone therapy   4. OSA (obstructive sleep apnea)   5. Essential hypertension, benign   6. Chronic anticoagulation   7. Educated about COVID-19 virus infection    PLAN:    In order of problems listed above:  1. He has stable angina if angina  is present at all.  Upon retaking the history, perhaps some of the discomfort is musculoskeletal.  He had minimal abnormality on perfusion imaging.  EF is normal.  There is no exertional component.  He did not improve on low-dose isosorbide and in fact has felt fatigued and lightheaded.  We will plan to discontinue isosorbide and follow clinically. 2. As of volume overload 3. Low intensity amiodarone therapy without evidence of toxicity is controlling atrial fibrillation. 4. CPAP is being used compliantly. 5. Blood pressure is low.  Will discontinue isosorbide.  May have to decrease intensity of diuretic/ACE inhibitor as he is now on Jardiance and may be getting diuretic impact from it.   6. Continue Coumadin therapy 7. COVID-19 vaccinated and practicing mitigation. 8.    Medication Adjustments/Labs and Tests Ordered: Current medicines are reviewed at length with the patient today.  Concerns regarding medicines are outlined above.  No orders of the defined types were placed in this encounter.  No orders of the defined types were placed in this encounter.   Patient Instructions  Medication Instructions:  1) DISCONTINUE Isosorbide  *If you need a refill on your cardiac medications before your next appointment, please call your pharmacy*   Lab Work: None If you have labs (blood work) drawn today and your tests are completely normal, you will receive your  results only by: Marland Kitchen MyChart Message (if you have MyChart) OR . A paper copy in the mail If you have any lab test that is abnormal or we need to change your treatment, we will call you to review the results.   Testing/Procedures: None   Follow-Up: At [facility], you and your health needs are our priority.  As part of our continuing mission to provide you with exceptional heart care, we have created designated Provider Care Teams.  These Care Teams include your primary Cardiologist (physician) and Advanced Practice Providers (APPs -  Physician Assistants and Nurse Practitioners) who all work together to provide you with the care you need, when you need it.  We recommend signing up for the patient portal called "MyChart".  Sign up information is provided on this After Visit Summary.  MyChart is used to connect with patients for Virtual Visits (Telemedicine).  Patients are able to view lab/test results, encounter notes, upcoming appointments, etc.  Non-urgent messages can be sent to your provider as well.   To learn more about what you can do with MyChart, go to [email or web address].    Your next appointment:   1 year(s)  The format for your next appointment:   In Person  Provider:   You may see [name], MD or one of the following Advanced Practice Providers on your designated Care Team:    Kathyrn Drown, NP    Other Instructions      Signed, [name], MD  06/26/2020 2:24 PM    [place]

## 2020-06-26 ENCOUNTER — Ambulatory Visit: Payer: PPO | Admitting: Interventional Cardiology

## 2020-06-26 ENCOUNTER — Other Ambulatory Visit: Payer: Self-pay

## 2020-06-26 ENCOUNTER — Encounter: Payer: Self-pay | Admitting: Interventional Cardiology

## 2020-06-26 VITALS — BP 90/50 | HR 85 | Ht 72.0 in | Wt 257.0 lb

## 2020-06-26 DIAGNOSIS — I5032 Chronic diastolic (congestive) heart failure: Secondary | ICD-10-CM

## 2020-06-26 DIAGNOSIS — Z79899 Other long term (current) drug therapy: Secondary | ICD-10-CM | POA: Diagnosis not present

## 2020-06-26 DIAGNOSIS — Z7189 Other specified counseling: Secondary | ICD-10-CM | POA: Diagnosis not present

## 2020-06-26 DIAGNOSIS — I10 Essential (primary) hypertension: Secondary | ICD-10-CM | POA: Diagnosis not present

## 2020-06-26 DIAGNOSIS — G4733 Obstructive sleep apnea (adult) (pediatric): Secondary | ICD-10-CM | POA: Diagnosis not present

## 2020-06-26 DIAGNOSIS — I25708 Atherosclerosis of coronary artery bypass graft(s), unspecified, with other forms of angina pectoris: Secondary | ICD-10-CM | POA: Diagnosis not present

## 2020-06-26 DIAGNOSIS — Z7901 Long term (current) use of anticoagulants: Secondary | ICD-10-CM | POA: Diagnosis not present

## 2020-06-26 NOTE — Patient Instructions (Signed)
Medication Instructions:  1) DISCONTINUE Isosorbide  *If you need a refill on your cardiac medications before your next appointment, please call your pharmacy*   Lab Work: None If you have labs (blood work) drawn today and your tests are completely normal, you will receive your results only by: Marland Kitchen MyChart Message (if you have MyChart) OR . A paper copy in the mail If you have any lab test that is abnormal or we need to change your treatment, we will call you to review the results.   Testing/Procedures: None   Follow-Up: At [facility], you and your health needs are our priority.  As part of our continuing mission to provide you with exceptional heart care, we have created designated Provider Care Teams.  These Care Teams include your primary Cardiologist (physician) and Advanced Practice Providers (APPs -  Physician Assistants and Nurse Practitioners) who all work together to provide you with the care you need, when you need it.  We recommend signing up for the patient portal called "MyChart".  Sign up information is provided on this After Visit Summary.  MyChart is used to connect with patients for Virtual Visits (Telemedicine).  Patients are able to view lab/test results, encounter notes, upcoming appointments, etc.  Non-urgent messages can be sent to your provider as well.   To learn more about what you can do with MyChart, go to [email or web address].    Your next appointment:   1 year(s)  The format for your next appointment:   In Person  Provider:   You may see [name], MD or one of the following Advanced Practice Providers on your designated Care Team:    Kathyrn Drown, NP    Other Instructions

## 2020-06-28 DIAGNOSIS — E1165 Type 2 diabetes mellitus with hyperglycemia: Secondary | ICD-10-CM | POA: Diagnosis not present

## 2020-06-28 DIAGNOSIS — I10 Essential (primary) hypertension: Secondary | ICD-10-CM | POA: Diagnosis not present

## 2020-06-28 DIAGNOSIS — M169 Osteoarthritis of hip, unspecified: Secondary | ICD-10-CM | POA: Diagnosis not present

## 2020-06-28 DIAGNOSIS — I2510 Atherosclerotic heart disease of native coronary artery without angina pectoris: Secondary | ICD-10-CM | POA: Diagnosis not present

## 2020-06-28 DIAGNOSIS — E7800 Pure hypercholesterolemia, unspecified: Secondary | ICD-10-CM | POA: Diagnosis not present

## 2020-06-28 DIAGNOSIS — G8929 Other chronic pain: Secondary | ICD-10-CM | POA: Diagnosis not present

## 2020-06-28 DIAGNOSIS — I480 Paroxysmal atrial fibrillation: Secondary | ICD-10-CM | POA: Diagnosis not present

## 2020-07-02 DIAGNOSIS — M47816 Spondylosis without myelopathy or radiculopathy, lumbar region: Secondary | ICD-10-CM | POA: Diagnosis not present

## 2020-07-16 DIAGNOSIS — M47816 Spondylosis without myelopathy or radiculopathy, lumbar region: Secondary | ICD-10-CM | POA: Diagnosis not present

## 2020-07-19 ENCOUNTER — Telehealth: Payer: Self-pay | Admitting: *Deleted

## 2020-07-19 NOTE — Telephone Encounter (Signed)
   [facility] Pre-operative Risk Assessment    HEARTCARE STAFF: - Please ensure there is not already an duplicate clearance open for this procedure. - Under Visit Info/Reason for Call, type in Other and utilize the format Clearance MM/DD/YY or Clearance TBD. Do not use dashes or single digits. - If request is for dental extraction, please clarify the # of teeth to be extracted.  Request for surgical clearance:  1. What type of surgery is being performed? B/L L3/L4/L5 LUMBAR RADIOFREQUENCY ABLATION   2. When is this surgery scheduled? TBD   3. What type of clearance is required (medical clearance vs. Pharmacy clearance to hold med vs. Both)? BOTH  4. Are there any medications that need to be held prior to surgery and how long? ELIQUIS x 3 DAYS PRIOR TO PROCEDURE   5. Practice name and name of physician performing surgery? [facility]; DR. HAO WANG  6. What is the office phone number? [phone]   7.   What is the office fax number? [phone] ATTN: AMY FORTIN, MA  8.   Anesthesia type (None, local, MAC, general) ? LOCAL   Julaine Hua 07/19/2020, 10:55 AM  _________________________________________________________________   (provider comments below)

## 2020-07-19 NOTE — Telephone Encounter (Signed)
Patient with diagnosis of aflutter on Eliquis for anticoagulation.    Procedure: B/L L3/L4/L5 LUMBAR RADIOFREQUENCY ABLATION   Date of procedure: TBD  CHA2DS2-VASc Score = 6  This indicates a 9.7% annual risk of stroke. The patient's score is based upon: CHF History: Yes HTN History: Yes Diabetes History: Yes Stroke History: No Vascular Disease History: Yes Age Score: 2 Gender Score: 0   CrCl 15mL/min Platelet count 263K  Per office protocol, patient can hold Eliquis for 3 days prior to procedure.

## 2020-07-19 NOTE — Telephone Encounter (Signed)
   Primary Cardiologist: [name], MD  Chart reviewed as part of pre-operative protocol coverage. Given past medical history and time since last visit, based on ACC/AHA guidelines, Joshua Terrell would be at acceptable risk for the planned procedure without further cardiovascular testing. He was last seen 06/26/20 by Dr. Tamala Julian and doing well from cardiac perspective.  Per pharmacy review and office protocols he may hold his Eliquis 3 days prior to the planned procedure.   I will route this recommendation to the requesting party via Epic fax function and remove from pre-op pool.  Please call with questions.  Loel Dubonnet, NP 07/19/2020, 4:07 PM

## 2020-07-23 DIAGNOSIS — M5136 Other intervertebral disc degeneration, lumbar region: Secondary | ICD-10-CM | POA: Diagnosis not present

## 2020-07-23 DIAGNOSIS — I10 Essential (primary) hypertension: Secondary | ICD-10-CM | POA: Diagnosis not present

## 2020-07-23 DIAGNOSIS — E1169 Type 2 diabetes mellitus with other specified complication: Secondary | ICD-10-CM | POA: Diagnosis not present

## 2020-07-23 DIAGNOSIS — Z794 Long term (current) use of insulin: Secondary | ICD-10-CM | POA: Diagnosis not present

## 2020-07-23 DIAGNOSIS — G8929 Other chronic pain: Secondary | ICD-10-CM | POA: Diagnosis not present

## 2020-07-31 ENCOUNTER — Encounter (INDEPENDENT_AMBULATORY_CARE_PROVIDER_SITE_OTHER): Payer: Self-pay

## 2020-08-07 DIAGNOSIS — M47816 Spondylosis without myelopathy or radiculopathy, lumbar region: Secondary | ICD-10-CM | POA: Diagnosis not present

## 2020-08-14 DIAGNOSIS — G4733 Obstructive sleep apnea (adult) (pediatric): Secondary | ICD-10-CM | POA: Diagnosis not present

## 2020-08-27 DIAGNOSIS — I2510 Atherosclerotic heart disease of native coronary artery without angina pectoris: Secondary | ICD-10-CM | POA: Diagnosis not present

## 2020-08-27 DIAGNOSIS — I480 Paroxysmal atrial fibrillation: Secondary | ICD-10-CM | POA: Diagnosis not present

## 2020-08-27 DIAGNOSIS — I10 Essential (primary) hypertension: Secondary | ICD-10-CM | POA: Diagnosis not present

## 2020-08-27 DIAGNOSIS — G8929 Other chronic pain: Secondary | ICD-10-CM | POA: Diagnosis not present

## 2020-08-27 DIAGNOSIS — E7800 Pure hypercholesterolemia, unspecified: Secondary | ICD-10-CM | POA: Diagnosis not present

## 2020-08-27 DIAGNOSIS — M169 Osteoarthritis of hip, unspecified: Secondary | ICD-10-CM | POA: Diagnosis not present

## 2020-08-27 DIAGNOSIS — E1165 Type 2 diabetes mellitus with hyperglycemia: Secondary | ICD-10-CM | POA: Diagnosis not present

## 2020-08-27 DIAGNOSIS — E1169 Type 2 diabetes mellitus with other specified complication: Secondary | ICD-10-CM | POA: Diagnosis not present

## 2020-09-03 DIAGNOSIS — M47816 Spondylosis without myelopathy or radiculopathy, lumbar region: Secondary | ICD-10-CM | POA: Diagnosis not present

## 2020-09-30 DIAGNOSIS — M47816 Spondylosis without myelopathy or radiculopathy, lumbar region: Secondary | ICD-10-CM | POA: Diagnosis not present

## 2020-10-14 ENCOUNTER — Ambulatory Visit (INDEPENDENT_AMBULATORY_CARE_PROVIDER_SITE_OTHER): Payer: PPO

## 2020-10-14 ENCOUNTER — Ambulatory Visit: Payer: PPO | Admitting: Podiatry

## 2020-10-14 ENCOUNTER — Other Ambulatory Visit: Payer: Self-pay

## 2020-10-14 ENCOUNTER — Encounter: Payer: Self-pay | Admitting: Podiatry

## 2020-10-14 DIAGNOSIS — L600 Ingrowing nail: Secondary | ICD-10-CM

## 2020-10-14 DIAGNOSIS — M79671 Pain in right foot: Secondary | ICD-10-CM

## 2020-10-14 NOTE — Patient Instructions (Signed)

## 2020-10-14 NOTE — Progress Notes (Signed)
Subjective:   Patient ID: Joshua Terrell, male   DOB: 75 y.o.   MRN: [id number]   HPI Patient presents with incurvated right hallux nail lateral border painful when pressed making shoe gear difficult.  States that he tries to trim and cannot do it anymore he needs it faxed   ROS      Objective:  Physical Exam  Neurovascular status intact with patient found to have incurvated right hallux lateral border painful when pressed making shoe gear difficult with no active drainage or redness     Assessment:  Ingrown toenail deformity right hallux lateral border     Plan:  H&P reviewed condition recommended correction.  Allowed him to sign consent form after review infiltrated the right hallux 60 mg like Marcaine mixture sterile prep done and using sterile instrumentation remove the lateral border exposed matrix applied phenol 3 applications 30 seconds followed by alcohol lavage sterile dressing gave instructions on soaks and leave dressing on 24 hours but take it off earlier if any throbbing were to occur and encouraged him to call with questions or concerns

## 2020-11-21 DIAGNOSIS — M5136 Other intervertebral disc degeneration, lumbar region: Secondary | ICD-10-CM | POA: Diagnosis not present

## 2020-11-21 DIAGNOSIS — Z1389 Encounter for screening for other disorder: Secondary | ICD-10-CM | POA: Diagnosis not present

## 2020-11-21 DIAGNOSIS — I2510 Atherosclerotic heart disease of native coronary artery without angina pectoris: Secondary | ICD-10-CM | POA: Diagnosis not present

## 2020-11-21 DIAGNOSIS — E1169 Type 2 diabetes mellitus with other specified complication: Secondary | ICD-10-CM | POA: Diagnosis not present

## 2020-11-21 DIAGNOSIS — E7800 Pure hypercholesterolemia, unspecified: Secondary | ICD-10-CM | POA: Diagnosis not present

## 2020-11-21 DIAGNOSIS — G8929 Other chronic pain: Secondary | ICD-10-CM | POA: Diagnosis not present

## 2020-11-21 DIAGNOSIS — I10 Essential (primary) hypertension: Secondary | ICD-10-CM | POA: Diagnosis not present

## 2020-11-21 DIAGNOSIS — Z0000 Encounter for general adult medical examination without abnormal findings: Secondary | ICD-10-CM | POA: Diagnosis not present

## 2020-11-21 DIAGNOSIS — I480 Paroxysmal atrial fibrillation: Secondary | ICD-10-CM | POA: Diagnosis not present

## 2020-11-21 DIAGNOSIS — G4733 Obstructive sleep apnea (adult) (pediatric): Secondary | ICD-10-CM | POA: Diagnosis not present

## 2020-11-21 DIAGNOSIS — Z794 Long term (current) use of insulin: Secondary | ICD-10-CM | POA: Diagnosis not present

## 2020-11-27 DIAGNOSIS — G8929 Other chronic pain: Secondary | ICD-10-CM | POA: Diagnosis not present

## 2020-11-27 DIAGNOSIS — E7800 Pure hypercholesterolemia, unspecified: Secondary | ICD-10-CM | POA: Diagnosis not present

## 2020-11-27 DIAGNOSIS — E1165 Type 2 diabetes mellitus with hyperglycemia: Secondary | ICD-10-CM | POA: Diagnosis not present

## 2020-11-27 DIAGNOSIS — M169 Osteoarthritis of hip, unspecified: Secondary | ICD-10-CM | POA: Diagnosis not present

## 2020-11-27 DIAGNOSIS — E1169 Type 2 diabetes mellitus with other specified complication: Secondary | ICD-10-CM | POA: Diagnosis not present

## 2020-11-27 DIAGNOSIS — I10 Essential (primary) hypertension: Secondary | ICD-10-CM | POA: Diagnosis not present

## 2020-11-27 DIAGNOSIS — I2510 Atherosclerotic heart disease of native coronary artery without angina pectoris: Secondary | ICD-10-CM | POA: Diagnosis not present

## 2020-11-27 DIAGNOSIS — I480 Paroxysmal atrial fibrillation: Secondary | ICD-10-CM | POA: Diagnosis not present

## 2020-12-06 DIAGNOSIS — H18413 Arcus senilis, bilateral: Secondary | ICD-10-CM | POA: Diagnosis not present

## 2020-12-06 DIAGNOSIS — H4710 Unspecified papilledema: Secondary | ICD-10-CM | POA: Diagnosis not present

## 2020-12-06 DIAGNOSIS — Z961 Presence of intraocular lens: Secondary | ICD-10-CM | POA: Diagnosis not present

## 2020-12-06 DIAGNOSIS — H31002 Unspecified chorioretinal scars, left eye: Secondary | ICD-10-CM | POA: Diagnosis not present

## 2021-01-13 DIAGNOSIS — I480 Paroxysmal atrial fibrillation: Secondary | ICD-10-CM | POA: Diagnosis not present

## 2021-01-13 DIAGNOSIS — I2510 Atherosclerotic heart disease of native coronary artery without angina pectoris: Secondary | ICD-10-CM | POA: Diagnosis not present

## 2021-01-13 DIAGNOSIS — I10 Essential (primary) hypertension: Secondary | ICD-10-CM | POA: Diagnosis not present

## 2021-01-13 DIAGNOSIS — E1165 Type 2 diabetes mellitus with hyperglycemia: Secondary | ICD-10-CM | POA: Diagnosis not present

## 2021-01-13 DIAGNOSIS — E7800 Pure hypercholesterolemia, unspecified: Secondary | ICD-10-CM | POA: Diagnosis not present

## 2021-01-13 DIAGNOSIS — G8929 Other chronic pain: Secondary | ICD-10-CM | POA: Diagnosis not present

## 2021-01-13 DIAGNOSIS — E1169 Type 2 diabetes mellitus with other specified complication: Secondary | ICD-10-CM | POA: Diagnosis not present

## 2021-01-29 DIAGNOSIS — M25571 Pain in right ankle and joints of right foot: Secondary | ICD-10-CM | POA: Diagnosis not present

## 2021-02-07 ENCOUNTER — Encounter (HOSPITAL_BASED_OUTPATIENT_CLINIC_OR_DEPARTMENT_OTHER): Payer: Self-pay | Admitting: *Deleted

## 2021-02-07 ENCOUNTER — Other Ambulatory Visit: Payer: Self-pay

## 2021-02-07 ENCOUNTER — Emergency Department (HOSPITAL_BASED_OUTPATIENT_CLINIC_OR_DEPARTMENT_OTHER)
Admission: EM | Admit: 2021-02-07 | Discharge: 2021-02-07 | Disposition: A | Discharge status: Home / Self Care | Payer: PPO | Attending: Emergency Medicine | Admitting: Emergency Medicine

## 2021-02-07 ENCOUNTER — Emergency Department (HOSPITAL_BASED_OUTPATIENT_CLINIC_OR_DEPARTMENT_OTHER): Payer: PPO

## 2021-02-07 DIAGNOSIS — B349 Viral infection, unspecified: Secondary | ICD-10-CM | POA: Insufficient documentation

## 2021-02-07 DIAGNOSIS — Z951 Presence of aortocoronary bypass graft: Secondary | ICD-10-CM | POA: Diagnosis not present

## 2021-02-07 DIAGNOSIS — S0990XA Unspecified injury of head, initial encounter: Secondary | ICD-10-CM | POA: Diagnosis not present

## 2021-02-07 DIAGNOSIS — R531 Weakness: Secondary | ICD-10-CM | POA: Insufficient documentation

## 2021-02-07 DIAGNOSIS — R197 Diarrhea, unspecified: Secondary | ICD-10-CM | POA: Diagnosis not present

## 2021-02-07 DIAGNOSIS — J069 Acute upper respiratory infection, unspecified: Secondary | ICD-10-CM | POA: Diagnosis not present

## 2021-02-07 DIAGNOSIS — I5032 Chronic diastolic (congestive) heart failure: Secondary | ICD-10-CM | POA: Diagnosis not present

## 2021-02-07 DIAGNOSIS — Z20822 Contact with and (suspected) exposure to covid-19: Secondary | ICD-10-CM | POA: Diagnosis not present

## 2021-02-07 DIAGNOSIS — W19XXXA Unspecified fall, initial encounter: Secondary | ICD-10-CM | POA: Diagnosis not present

## 2021-02-07 DIAGNOSIS — R519 Headache, unspecified: Secondary | ICD-10-CM | POA: Diagnosis not present

## 2021-02-07 DIAGNOSIS — I2510 Atherosclerotic heart disease of native coronary artery without angina pectoris: Secondary | ICD-10-CM | POA: Insufficient documentation

## 2021-02-07 DIAGNOSIS — Z7901 Long term (current) use of anticoagulants: Secondary | ICD-10-CM | POA: Diagnosis not present

## 2021-02-07 DIAGNOSIS — Z7984 Long term (current) use of oral hypoglycemic drugs: Secondary | ICD-10-CM | POA: Insufficient documentation

## 2021-02-07 DIAGNOSIS — R42 Dizziness and giddiness: Secondary | ICD-10-CM | POA: Insufficient documentation

## 2021-02-07 DIAGNOSIS — D6869 Other thrombophilia: Secondary | ICD-10-CM | POA: Diagnosis not present

## 2021-02-07 DIAGNOSIS — Z79899 Other long term (current) drug therapy: Secondary | ICD-10-CM | POA: Insufficient documentation

## 2021-02-07 DIAGNOSIS — Z87891 Personal history of nicotine dependence: Secondary | ICD-10-CM | POA: Diagnosis not present

## 2021-02-07 DIAGNOSIS — J029 Acute pharyngitis, unspecified: Secondary | ICD-10-CM | POA: Diagnosis not present

## 2021-02-07 DIAGNOSIS — R5383 Other fatigue: Secondary | ICD-10-CM | POA: Insufficient documentation

## 2021-02-07 DIAGNOSIS — Z794 Long term (current) use of insulin: Secondary | ICD-10-CM | POA: Insufficient documentation

## 2021-02-07 DIAGNOSIS — I110 Hypertensive heart disease with heart failure: Secondary | ICD-10-CM | POA: Insufficient documentation

## 2021-02-07 DIAGNOSIS — E119 Type 2 diabetes mellitus without complications: Secondary | ICD-10-CM | POA: Diagnosis not present

## 2021-02-07 DIAGNOSIS — R059 Cough, unspecified: Secondary | ICD-10-CM | POA: Diagnosis not present

## 2021-02-07 DIAGNOSIS — R112 Nausea with vomiting, unspecified: Secondary | ICD-10-CM | POA: Diagnosis not present

## 2021-02-07 LAB — URINALYSIS, MICROSCOPIC (REFLEX)
RBC / HPF: NONE SEEN RBC/hpf (ref 0–5)
WBC, UA: NONE SEEN WBC/hpf (ref 0–5)

## 2021-02-07 LAB — BASIC METABOLIC PANEL
Anion gap: 11 (ref 5–15)
BUN: 32 mg/dL — ABNORMAL HIGH (ref 8–23)
CO2: 23 mmol/L (ref 22–32)
Calcium: 9.9 mg/dL (ref 8.9–10.3)
Chloride: 101 mmol/L (ref 98–111)
Creatinine, Ser: 1.24 mg/dL (ref 0.61–1.24)
GFR, Estimated: 60 mL/min — ABNORMAL LOW (ref >60)
Glucose, Bld: 116 mg/dL — ABNORMAL HIGH (ref 70–99)
Potassium: 4.6 mmol/L (ref 3.5–5.1)
Sodium: 135 mmol/L (ref 135–145)

## 2021-02-07 LAB — RESP PANEL BY RT-PCR (FLU A&B, COVID) ARPGX2
Influenza A by PCR: NEGATIVE
Influenza B by PCR: NEGATIVE
SARS Coronavirus 2 by RT PCR: NEGATIVE

## 2021-02-07 LAB — CBC
HCT: 43.8 % (ref 39.0–52.0)
Hemoglobin: 14.6 g/dL (ref 13.0–17.0)
MCH: 30.2 pg (ref 26.0–34.0)
MCHC: 33.3 g/dL (ref 30.0–36.0)
MCV: 90.5 fL (ref 80.0–100.0)
Platelets: 331 10*3/uL (ref 150–400)
RBC: 4.84 MIL/uL (ref 4.22–5.81)
RDW: 13.5 % (ref 11.5–15.5)
WBC: 8 10*3/uL (ref 4.0–10.5)
nRBC: 0 % (ref 0.0–0.2)

## 2021-02-07 LAB — URINALYSIS, ROUTINE W REFLEX MICROSCOPIC
Bilirubin Urine: NEGATIVE
Glucose, UA: >=500 mg/dL — AB
Hgb urine dipstick: NEGATIVE
Ketones, ur: NEGATIVE mg/dL
Leukocytes,Ua: NEGATIVE
Nitrite: NEGATIVE
Protein, ur: NEGATIVE mg/dL
Specific Gravity, Urine: 1.015 (ref 1.005–1.030)
pH: 5 (ref 5.0–8.0)

## 2021-02-07 LAB — CBG MONITORING, ED
Glucose-Capillary: 107 mg/dL — ABNORMAL HIGH (ref 70–99)
Glucose-Capillary: 142 mg/dL — ABNORMAL HIGH (ref 70–99)

## 2021-02-07 MED ORDER — SODIUM CHLORIDE 0.9 % IV BOLUS
1000.0000 mL | Freq: Once | INTRAVENOUS | Status: AC
  Administered 2021-02-07: 1000 mL via INTRAVENOUS

## 2021-02-07 NOTE — ED Notes (Signed)
RN @ bedside for labs, attempt cxr later.

## 2021-02-07 NOTE — ED Provider Notes (Signed)
[place] EMERGENCY DEPARTMENT Provider Note   CSN: [id number] Arrival date & time: 02/07/21  1118     History Chief Complaint  Patient presents with   Joshua Terrell is a 75 y.o. male.  Patient is a 75 year old male with a history of coronary artery disease, diabetes, hypertension, hyperlipidemia, obstructive sleep apnea who presents with weakness and falls.  He has been feeling bad for about a week and a half.  He states it started after he ate some [place] food.  About an hour or so later he had abdominal cramping and diarrhea.  He has had some watery diarrhea since that time although a couple days ago got better and now he has 2-3 loose stools per day.  He denies any abdominal pain.  He has been having some nasal congestion and sore throat.  He is also been having some fatigue and myalgias.  No known fevers.  He has felt lightheaded on standing.  He says normally he feels lightheaded when he first stands up and sometimes it lasts about 8 to 10 seconds.  This is caused him to fall a few times although he denies any injuries from the fall.  However his wife said he did hit his nose on something during 1 fall.  He does not report that he has hit his head.  However he is on Eliquis.  He has not had any nausea or vomiting.  He has had a little bit of coughing.  He took a home COVID test yesterday that was negative.  He denies any chest pain or shortness of breath.  He just feels very fatigued.  He denies any spinning sensation.  No dizziness other than when he first stands up.  He does not have any difficulty with his balance other than when he first stands up.  No numbness or weakness in his extremities.  No speech deficits or vision changes.  He says his blood sugars have been up and down over the last week or so.      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 01/17/2020   Steroid induced glaucoma, left eye 01/17/2020   Posterior vitreous detachment of both eyes 01/17/2020   Severe obstructive sleep apnea 02/22/2014   Type II or unspecified type diabetes mellitus without mention of complication, uncontrolled 03/20/2013    Class: Chronic   Essential hypertension, benign 03/20/2013    Class: Chronic   Morbid obesity ([place]) 03/20/2013    Class: Chronic   Chronic diastolic heart failure ([place]) 03/20/2013    Class: Chronic   Angina pectoris ([place]) 02/09/2013    Class: Chronic   CAD (coronary artery disease) of artery bypass graft 02/09/2013    Past Surgical History:  Procedure Laterality Date   CORONARY ANGIOPLASTY     CORONARY ARTERY BYPASS GRAFT     VASECTOMY         Family History  Problem Relation Age of Onset  Healthy Mother    Heart attack Father    Heart disease Father    Healthy Sister    Other Brother        CAR ACCIDENT   Healthy Sister     Social History   Tobacco Use   Smoking status: Former   Smokeless tobacco: Never  Scientific laboratory technician Use: Never used  Substance Use Topics   Alcohol use: No    Alcohol/week: 0.0 standard drinks   Drug use: No    Home Medications Prior to Admission medications   Medication Sig Start Date End Date Taking? Authorizing Provider  amiodarone (PACERONE) 200 MG tablet Take 100 mg by mouth daily.    [provider]  apixaban (ELIQUIS) 5 MG TABS tablet Take 5 mg by mouth 2 (two) times daily.    [provider]  atorvastatin (LIPITOR) 80 MG tablet Take 80 mg by mouth daily.  01/03/13   [provider]  diltiazem (CARDIZEM CD)  180 MG 24 hr capsule TAKE 1 CAPSULE BY MOUTH DAILY 03/07/20   Belva Crome, MD  DULoxetine (CYMBALTA) 60 MG capsule 1 capsule 09/30/20   [provider]  empagliflozin (JARDIANCE) 10 MG TABS tablet Take 10 mg by mouth daily.    [provider]  insulin NPH Human (NOVOLIN N) 100 UNIT/ML injection Inject 40 units into the skin each morning and 45 units into the skin each evening.    [provider]  lisinopril-hydrochlorothiazide (PRINZIDE,ZESTORETIC) 20-12.5 MG tablet Take 1 tablet by mouth daily. 09/26/15   [provider]  meloxicam (MOBIC) 15 MG tablet Take 15 mg by mouth daily. 09/23/19   [provider]  metFORMIN (GLUCOPHAGE-XR) 500 MG 24 hr tablet Take 2 tablets (1000 mg) by mouth twice daily 01/07/17   [provider]  Multiple Vitamin (MULTIVITAMIN WITH MINERALS) TABS tablet Take 1 tablet by mouth daily.    [provider]  nitroGLYCERIN (NITROSTAT) 0.4 MG SL tablet Place 1 tablet (0.4 mg total) under the tongue every 5 (five) minutes as needed for chest pain. 05/15/20   Belva Crome, MD  oxyCODONE-acetaminophen (PERCOCET/ROXICET) 5-325 MG tablet Take 1 tablet by mouth at bedtime as needed (pain).  08/22/15   [provider]  TRULICITY 1.5 0000000 SOPN Inject 1.5 mg into the skin every Sunday. 10/22/17   [provider]    Allergies    Patient has no known allergies.  Review of Systems   Review of Systems  Constitutional:  Positive for chills and fatigue. Negative for diaphoresis and fever.  HENT:  Positive for congestion, rhinorrhea and sore throat. Negative for sneezing and trouble swallowing.   Eyes: Negative.   Respiratory:  Positive for cough. Negative for chest tightness and shortness of breath.   Cardiovascular:  Negative for chest pain and leg swelling.  Gastrointestinal:  Positive for diarrhea. Negative for abdominal pain, blood in stool, nausea and vomiting.  Genitourinary:  Negative for difficulty  urinating, flank pain, frequency and hematuria.  Musculoskeletal:  Positive for myalgias. Negative for arthralgias and back pain.  Skin:  Negative for rash.  Neurological:  Positive for light-headedness. Negative for dizziness, speech difficulty, weakness, numbness and headaches.   Physical Exam Updated Vital Signs BP 136/60   Pulse 61   Temp 98.6 F (37 C) (Oral)   Resp 15   Wt 120.2 kg   SpO2 98%   BMI 35.94 kg/m   Physical Exam Constitutional:      Appearance: He is  well-developed.  HENT:     Head: Normocephalic and atraumatic.     Mouth/Throat:     Mouth: Mucous membranes are moist.     Pharynx: No oropharyngeal exudate or posterior oropharyngeal erythema.     Comments: Oropharynx is clear without erythema.  No exudates.  Uvula is midline.  No trismus.  He has a small abrasion with some mild swelling over the bridge of the nose.  There is no deformity noted.  No septal hematoma.  No other bony tenderness noted to the facial bones.  Extraocular eye movements are intact. Eyes:     Pupils: Pupils are equal, round, and reactive to light.  Cardiovascular:     Rate and Rhythm: Normal rate and regular rhythm.     Heart sounds: Normal heart sounds.  Pulmonary:     Effort: Pulmonary effort is normal. No respiratory distress.     Breath sounds: Normal breath sounds. No wheezing or rales.  Chest:     Chest wall: No tenderness.  Abdominal:     General: Bowel sounds are normal.     Palpations: Abdomen is soft.     Tenderness: There is no abdominal tenderness. There is no guarding or rebound.  Musculoskeletal:        General: Normal range of motion.     Cervical back: Normal range of motion and neck supple.     Comments: No pain along the cervical, thoracic or lumbosacral spine, no pain on palpation or range of motion of the extremities.  He has some abrasions to his lower legs without underlying bony tenderness.  Lymphadenopathy:     Cervical: No cervical adenopathy.  Skin:     General: Skin is warm and dry.     Findings: No rash.  Neurological:     General: No focal deficit present.     Mental Status: He is alert and oriented to person, place, and time.    ED Results / Procedures / Treatments   Labs (all labs ordered are listed, but only abnormal results are displayed) Labs Reviewed  BASIC METABOLIC PANEL - Abnormal; Notable for the following components:      Result Value   Glucose, Bld 116 (*)    BUN 32 (*)    GFR, Estimated 60 (*)    All other components within normal limits  URINALYSIS, ROUTINE W REFLEX MICROSCOPIC - Abnormal; Notable for the following components:   Glucose, UA >=500 (*)    All other components within normal limits  URINALYSIS, MICROSCOPIC (REFLEX) - Abnormal; Notable for the following components:   Bacteria, UA RARE (*)    All other components within normal limits  CBG MONITORING, ED - Abnormal; Notable for the following components:   Glucose-Capillary 107 (*)    All other components within normal limits  CBG MONITORING, ED - Abnormal; Notable for the following components:   Glucose-Capillary 142 (*)    All other components within normal limits  RESP PANEL BY RT-PCR (FLU A&B, COVID) [id number]  CBC    EKG None  Radiology DG Chest 2 View  Result Date: 02/07/2021 CLINICAL DATA:  Cough, frequent falls, dizziness, sore throat, fatigue, body aches, headaches, history open heart surgery and mini strokes EXAM: CHEST - 2 VIEW COMPARISON:  12/26/2018 FINDINGS: Normal heart size post CABG. Mediastinal contours and pulmonary vascularity normal. Atherosclerotic calcification aorta. Small calcified granuloma RIGHT upper lobe stable. No acute infiltrate, pleural effusion, or pneumothorax. Osseous structures unremarkable. IMPRESSION: No acute abnormalities. Aortic Atherosclerosis (ICD10-I70.0). Electronically Signed  By: Lavonia Dana M.D.   On: 02/07/2021 18:07   CT HEAD WO CONTRAST (5MM)  Result Date: 02/07/2021 CLINICAL DATA:  Dizziness in a  75 year old male with history frequent falls, patient on anticoagulation. EXAM: CT HEAD WITHOUT CONTRAST TECHNIQUE: Contiguous axial images were obtained from the base of the skull through the vertex without intravenous contrast. COMPARISON:  Prior MRI evaluation of August of 2021. FINDINGS: Brain: No evidence of acute infarction, hemorrhage, hydrocephalus, extra-axial collection or mass lesion/mass effect. Signs of atrophy and chronic microvascular ischemic change. Atrophy similar to prior MRI evaluations. Vascular: No hyperdense vessel or unexpected calcification. Skull: Normal. Negative for fracture or focal lesion. Sinuses/Orbits: Visualized paranasal sinuses and orbits are unremarkable. Other: None IMPRESSION: No acute intracranial abnormality. Signs of atrophy and chronic microvascular ischemic change. Electronically Signed   By: Zetta Bills M.D.   On: 02/07/2021 13:23    Procedures Procedures   Medications Ordered in ED Medications  sodium chloride 0.9 % bolus 1,000 mL (0 mLs Intravenous Stopped 02/07/21 1817)    ED Course  I have reviewed the triage vital signs and the nursing notes.  Pertinent labs & imaging results that were available during my care of the patient were reviewed by me and considered in my medical decision making (see chart for details).    MDM Rules/Calculators/A&P                           Patient is a 75 year old male who presents with about a week and a half history of some viral-like symptoms including some upper respiratory symptoms and loose stools.  He has no abdominal pain on exam.  He is otherwise well-appearing.  He has had some lightheadedness but no vertiginous type symptoms.  No neurologic deficits or suggestions of a stroke.  He does not have any chest pain, shortness of breath or other symptoms that sound more concerning for ACS.  His labs are nonconcerning.  His COVID/flu test was negative.  His abdomen is nontender.  He has no evidence of a UTI.  His  head CT shows no acute abnormality.  He has some milder tenderness and swelling to his nose but no other bony tenderness to his face so I did not feel that further imaging of this area was indicated.  No spinal tenderness.  He was given IV fluids.  He was able to ambulate without any dizziness or ataxia.  He was discharged home in good condition with a likely viral syndrome.  He was encouraged to follow-up next week with his PCP.  Return precautions were given. Final Clinical Impression(s) / ED Diagnoses Final diagnoses:  Viral syndrome    Rx / DC Orders ED Discharge Orders     None        Malvin Johns, MD 02/07/21 1900

## 2021-02-07 NOTE — ED Notes (Signed)
Ambulated in hallway w/o issue. Denies dizzyness or feeling off balance.

## 2021-02-07 NOTE — ED Triage Notes (Signed)
Pt was sent by PCP for further evaluation of frequent falls.  Pt reports that he has had frequent falls in the past 2 weeks and has had some dizziness and sore throat and fatigue and body aches and headaches, covid negative.  Pt reports that PCP wants him to have a CT head due to falls and being on elaquis and blood work due to intermittent hyperglycemia.  No neuro deficits.  Pt also reports recent suspected food poisoning from [place] food.

## 2021-02-18 DIAGNOSIS — M1711 Unilateral primary osteoarthritis, right knee: Secondary | ICD-10-CM | POA: Diagnosis not present

## 2021-02-18 DIAGNOSIS — M25461 Effusion, right knee: Secondary | ICD-10-CM | POA: Diagnosis not present

## 2021-03-03 DIAGNOSIS — M25571 Pain in right ankle and joints of right foot: Secondary | ICD-10-CM | POA: Diagnosis not present

## 2021-03-10 ENCOUNTER — Other Ambulatory Visit: Payer: Self-pay | Admitting: Interventional Cardiology

## 2021-03-17 DIAGNOSIS — M4696 Unspecified inflammatory spondylopathy, lumbar region: Secondary | ICD-10-CM | POA: Diagnosis not present

## 2021-03-19 DIAGNOSIS — E1165 Type 2 diabetes mellitus with hyperglycemia: Secondary | ICD-10-CM | POA: Diagnosis not present

## 2021-03-19 DIAGNOSIS — G8929 Other chronic pain: Secondary | ICD-10-CM | POA: Diagnosis not present

## 2021-03-19 DIAGNOSIS — M169 Osteoarthritis of hip, unspecified: Secondary | ICD-10-CM | POA: Diagnosis not present

## 2021-03-19 DIAGNOSIS — M1711 Unilateral primary osteoarthritis, right knee: Secondary | ICD-10-CM | POA: Diagnosis not present

## 2021-03-19 DIAGNOSIS — M19071 Primary osteoarthritis, right ankle and foot: Secondary | ICD-10-CM | POA: Diagnosis not present

## 2021-03-19 DIAGNOSIS — I480 Paroxysmal atrial fibrillation: Secondary | ICD-10-CM | POA: Diagnosis not present

## 2021-03-19 DIAGNOSIS — I2510 Atherosclerotic heart disease of native coronary artery without angina pectoris: Secondary | ICD-10-CM | POA: Diagnosis not present

## 2021-03-19 DIAGNOSIS — E7800 Pure hypercholesterolemia, unspecified: Secondary | ICD-10-CM | POA: Diagnosis not present

## 2021-03-19 DIAGNOSIS — E1169 Type 2 diabetes mellitus with other specified complication: Secondary | ICD-10-CM | POA: Diagnosis not present

## 2021-03-19 DIAGNOSIS — I10 Essential (primary) hypertension: Secondary | ICD-10-CM | POA: Diagnosis not present

## 2021-03-28 DIAGNOSIS — I10 Essential (primary) hypertension: Secondary | ICD-10-CM | POA: Diagnosis not present

## 2021-03-28 DIAGNOSIS — E1169 Type 2 diabetes mellitus with other specified complication: Secondary | ICD-10-CM | POA: Diagnosis not present

## 2021-03-28 DIAGNOSIS — M5136 Other intervertebral disc degeneration, lumbar region: Secondary | ICD-10-CM | POA: Diagnosis not present

## 2021-03-28 DIAGNOSIS — M19071 Primary osteoarthritis, right ankle and foot: Secondary | ICD-10-CM | POA: Diagnosis not present

## 2021-03-28 DIAGNOSIS — M1711 Unilateral primary osteoarthritis, right knee: Secondary | ICD-10-CM | POA: Diagnosis not present

## 2021-03-28 DIAGNOSIS — M169 Osteoarthritis of hip, unspecified: Secondary | ICD-10-CM | POA: Diagnosis not present

## 2021-04-21 DIAGNOSIS — E7800 Pure hypercholesterolemia, unspecified: Secondary | ICD-10-CM | POA: Diagnosis not present

## 2021-04-21 DIAGNOSIS — I480 Paroxysmal atrial fibrillation: Secondary | ICD-10-CM | POA: Diagnosis not present

## 2021-04-21 DIAGNOSIS — M1711 Unilateral primary osteoarthritis, right knee: Secondary | ICD-10-CM | POA: Diagnosis not present

## 2021-04-21 DIAGNOSIS — G8929 Other chronic pain: Secondary | ICD-10-CM | POA: Diagnosis not present

## 2021-04-21 DIAGNOSIS — M19071 Primary osteoarthritis, right ankle and foot: Secondary | ICD-10-CM | POA: Diagnosis not present

## 2021-04-21 DIAGNOSIS — E1165 Type 2 diabetes mellitus with hyperglycemia: Secondary | ICD-10-CM | POA: Diagnosis not present

## 2021-04-21 DIAGNOSIS — E1169 Type 2 diabetes mellitus with other specified complication: Secondary | ICD-10-CM | POA: Diagnosis not present

## 2021-04-21 DIAGNOSIS — I10 Essential (primary) hypertension: Secondary | ICD-10-CM | POA: Diagnosis not present

## 2021-04-21 DIAGNOSIS — I2510 Atherosclerotic heart disease of native coronary artery without angina pectoris: Secondary | ICD-10-CM | POA: Diagnosis not present

## 2021-04-23 DIAGNOSIS — M7061 Trochanteric bursitis, right hip: Secondary | ICD-10-CM | POA: Diagnosis not present

## 2021-04-23 DIAGNOSIS — M1611 Unilateral primary osteoarthritis, right hip: Secondary | ICD-10-CM | POA: Diagnosis not present

## 2021-04-23 DIAGNOSIS — M25551 Pain in right hip: Secondary | ICD-10-CM | POA: Diagnosis not present

## 2021-05-06 DIAGNOSIS — M25561 Pain in right knee: Secondary | ICD-10-CM | POA: Diagnosis not present

## 2021-05-06 DIAGNOSIS — M25571 Pain in right ankle and joints of right foot: Secondary | ICD-10-CM | POA: Diagnosis not present

## 2021-05-06 DIAGNOSIS — M25551 Pain in right hip: Secondary | ICD-10-CM | POA: Diagnosis not present

## 2021-05-06 DIAGNOSIS — M1711 Unilateral primary osteoarthritis, right knee: Secondary | ICD-10-CM | POA: Diagnosis not present

## 2021-05-14 DIAGNOSIS — G8929 Other chronic pain: Secondary | ICD-10-CM | POA: Diagnosis not present

## 2021-05-14 DIAGNOSIS — I480 Paroxysmal atrial fibrillation: Secondary | ICD-10-CM | POA: Diagnosis not present

## 2021-05-14 DIAGNOSIS — E1169 Type 2 diabetes mellitus with other specified complication: Secondary | ICD-10-CM | POA: Diagnosis not present

## 2021-05-14 DIAGNOSIS — E1165 Type 2 diabetes mellitus with hyperglycemia: Secondary | ICD-10-CM | POA: Diagnosis not present

## 2021-05-14 DIAGNOSIS — E7800 Pure hypercholesterolemia, unspecified: Secondary | ICD-10-CM | POA: Diagnosis not present

## 2021-05-14 DIAGNOSIS — I10 Essential (primary) hypertension: Secondary | ICD-10-CM | POA: Diagnosis not present

## 2021-06-11 DIAGNOSIS — E7800 Pure hypercholesterolemia, unspecified: Secondary | ICD-10-CM | POA: Diagnosis not present

## 2021-06-11 DIAGNOSIS — E1165 Type 2 diabetes mellitus with hyperglycemia: Secondary | ICD-10-CM | POA: Diagnosis not present

## 2021-06-11 DIAGNOSIS — E1169 Type 2 diabetes mellitus with other specified complication: Secondary | ICD-10-CM | POA: Diagnosis not present

## 2021-06-11 DIAGNOSIS — I480 Paroxysmal atrial fibrillation: Secondary | ICD-10-CM | POA: Diagnosis not present

## 2021-06-11 DIAGNOSIS — G8929 Other chronic pain: Secondary | ICD-10-CM | POA: Diagnosis not present

## 2021-06-11 DIAGNOSIS — I10 Essential (primary) hypertension: Secondary | ICD-10-CM | POA: Diagnosis not present

## 2021-06-13 ENCOUNTER — Encounter (INDEPENDENT_AMBULATORY_CARE_PROVIDER_SITE_OTHER): Payer: Self-pay

## 2021-06-13 DIAGNOSIS — H4710 Unspecified papilledema: Secondary | ICD-10-CM | POA: Diagnosis not present

## 2021-06-13 DIAGNOSIS — Z961 Presence of intraocular lens: Secondary | ICD-10-CM | POA: Diagnosis not present

## 2021-06-13 DIAGNOSIS — H18413 Arcus senilis, bilateral: Secondary | ICD-10-CM | POA: Diagnosis not present

## 2021-06-13 DIAGNOSIS — H02831 Dermatochalasis of right upper eyelid: Secondary | ICD-10-CM | POA: Diagnosis not present

## 2021-06-16 ENCOUNTER — Other Ambulatory Visit: Payer: Self-pay | Admitting: Interventional Cardiology

## 2021-06-26 DIAGNOSIS — M4696 Unspecified inflammatory spondylopathy, lumbar region: Secondary | ICD-10-CM | POA: Diagnosis not present

## 2021-06-30 DIAGNOSIS — M7062 Trochanteric bursitis, left hip: Secondary | ICD-10-CM | POA: Diagnosis not present

## 2021-06-30 DIAGNOSIS — M19071 Primary osteoarthritis, right ankle and foot: Secondary | ICD-10-CM | POA: Diagnosis not present

## 2021-07-04 DIAGNOSIS — I480 Paroxysmal atrial fibrillation: Secondary | ICD-10-CM | POA: Diagnosis not present

## 2021-07-04 DIAGNOSIS — I10 Essential (primary) hypertension: Secondary | ICD-10-CM | POA: Diagnosis not present

## 2021-07-04 DIAGNOSIS — E1165 Type 2 diabetes mellitus with hyperglycemia: Secondary | ICD-10-CM | POA: Diagnosis not present

## 2021-07-04 DIAGNOSIS — G8929 Other chronic pain: Secondary | ICD-10-CM | POA: Diagnosis not present

## 2021-07-04 DIAGNOSIS — E7800 Pure hypercholesterolemia, unspecified: Secondary | ICD-10-CM | POA: Diagnosis not present

## 2021-07-07 DIAGNOSIS — R350 Frequency of micturition: Secondary | ICD-10-CM | POA: Diagnosis not present

## 2021-07-29 DIAGNOSIS — M5136 Other intervertebral disc degeneration, lumbar region: Secondary | ICD-10-CM | POA: Diagnosis not present

## 2021-07-29 DIAGNOSIS — E1169 Type 2 diabetes mellitus with other specified complication: Secondary | ICD-10-CM | POA: Diagnosis not present

## 2021-07-29 DIAGNOSIS — I10 Essential (primary) hypertension: Secondary | ICD-10-CM | POA: Diagnosis not present

## 2021-07-30 DIAGNOSIS — Z794 Long term (current) use of insulin: Secondary | ICD-10-CM | POA: Diagnosis not present

## 2021-07-30 DIAGNOSIS — E1169 Type 2 diabetes mellitus with other specified complication: Secondary | ICD-10-CM | POA: Diagnosis not present

## 2021-07-30 DIAGNOSIS — I10 Essential (primary) hypertension: Secondary | ICD-10-CM | POA: Diagnosis not present

## 2021-07-30 DIAGNOSIS — E669 Obesity, unspecified: Secondary | ICD-10-CM | POA: Diagnosis not present

## 2021-08-04 DIAGNOSIS — M7062 Trochanteric bursitis, left hip: Secondary | ICD-10-CM | POA: Diagnosis not present

## 2021-08-04 NOTE — Progress Notes (Signed)
?Cardiology Office Note:   ? ?Date:  08/05/2021  ? ?ID:  DEXTER SIGNOR, DOB 1945-10-16, MRN [id number] ? ?PCP:  [name], MD  ?Cardiologist:  [name], MD  ? ?Referring MD: [name], MD  ? ?Chief Complaint  ?Patient presents with  ? Coronary Artery Disease  ? Follow-up  ?  Amiodarone  ? ? ?History of Present Illness:   ? ?Joshua Terrell is a 76 y.o. male with a hx of CAD with CABG 1994 including SVG to OM, SVG to RCA, and SVG to diagonal. LIMA to LAD is atretic.  Over the past 6 months he has been diagnosed with atypical atrial flutter, was started on amiodarone, had spontaneous conversion, and is now on apixaban and amiodarone 200 mg daily ? ? ?He is having exertional fatigue and some dyspnea.  He cannot tell if it is related to aging and deconditioning or something going on with his heart.  He denies angina.  There is no peripheral edema.  He denies orthopnea.  He denies claudication. ? ?Past Medical History:  ?Diagnosis Date  ? Coronary artery disease   ? PTCA of the codominant LAD, 1992, CABG July 94  ? Diabetes mellitus without complication ([place])   ? Diverticulosis   ? Erectile dysfunction   ? HTN (hypertension)   ? Hyperlipidemia   ? Myocardial infarction [facility])   ? Obesity   ? OSA (obstructive sleep apnea)   ? Tinnitus   ? ? ?Past Surgical History:  ?Procedure Laterality Date  ? CORONARY ANGIOPLASTY    ? CORONARY ARTERY BYPASS GRAFT    ? VASECTOMY    ? ? ?Current Medications: ?Current Meds  ?Medication Sig  ? amiodarone (PACERONE) 200 MG tablet TAKE 1 TABLET BY MOUTH DAILY  ? apixaban (ELIQUIS) 5 MG TABS tablet Take 5 mg by mouth 2 (two) times daily.  ? atorvastatin (LIPITOR) 80 MG tablet Take 80 mg by mouth daily.   ? diltiazem (CARDIZEM CD) 180 MG 24 hr capsule TAKE 1 CAPSULE BY MOUTH DAILY  ? DULoxetine (CYMBALTA) 60 MG capsule 1 capsule  ? empagliflozin (JARDIANCE) 10 MG TABS tablet Take 10 mg by mouth daily.  ? insulin NPH Human (NOVOLIN N) 100 UNIT/ML injection Inject 40 units into the  skin each morning and 45 units into the skin each evening.  ? lisinopril-hydrochlorothiazide (PRINZIDE,ZESTORETIC) 20-12.5 MG tablet Take 1 tablet by mouth daily.  ? meloxicam (MOBIC) 15 MG tablet Take 15 mg by mouth daily.  ? metFORMIN (GLUCOPHAGE-XR) 500 MG 24 hr tablet Take 2 tablets (1000 mg) by mouth twice daily  ? Multiple Vitamin (MULTIVITAMIN WITH MINERALS) TABS tablet Take 1 tablet by mouth daily.  ? nitroGLYCERIN (NITROSTAT) 0.4 MG SL tablet Place 1 tablet (0.4 mg total) under the tongue every 5 (five) minutes as needed for chest pain.  ? oxyCODONE-acetaminophen (PERCOCET/ROXICET) 5-325 MG tablet Take 1 tablet by mouth at bedtime as needed (pain).   ? TRULICITY 1.5 RX/5.4MG SOPN Inject 1.5 mg into the skin every Sunday.  ?  ? ?Allergies:   Patient has no known allergies.  ? ?Social History  ? ?Socioeconomic History  ? Marital status: Married  ?  Spouse name: Not on file  ? Number of children: Not on file  ? Years of education: Not on file  ? Highest education level: Not on file  ?Occupational History  ? Not on file  ?Tobacco Use  ? Smoking status: Former  ? Smokeless tobacco: Never  ?Vaping Use  ?  Vaping Use: Never used  ?Substance and Sexual Activity  ? Alcohol use: No  ?  Alcohol/week: 0.0 standard drinks  ? Drug use: No  ? Sexual activity: Not on file  ?Other Topics Concern  ? Not on file  ?Social History Narrative  ? Not on file  ? ?Social Determinants of Health  ? ?Financial Resource Strain: Not on file  ?Food Insecurity: Not on file  ?Transportation Needs: Not on file  ?Physical Activity: Not on file  ?Stress: Not on file  ?Social Connections: Not on file  ?  ? ?Family History: ?The patient's family history includes Healthy in his mother, sister, and sister; Heart attack in his father; Heart disease in his father; Other in his brother. ? ?ROS:   ?Please see the history of present illness.    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  He uses CPAP.  He sleeps more than 9  hours/day.  All other systems reviewed and are negative. ? ?EKGs/Labs/Other Studies Reviewed:   ? ?The following studies were reviewed today: ? ?2D Doppler echocardiogram 2017: ?Study Conclusions  ? ?- Left ventricle: The cavity size was normal. Systolic function was  ?  vigorous. The estimated ejection fraction was in the range of 65%  ?  to 70%. Wall motion was normal; there were no regional wall  ?  motion abnormalities. Doppler parameters are consistent with  ?  abnormal left ventricular relaxation (grade 1 diastolic  ?  dysfunction).  ?- Aortic valve: Trileaflet; mildly thickened, mildly calcified  ?  leaflets.  ?- Mitral valve: Calcified annulus.  ?- Right ventricle: The cavity size was mildly dilated. Wall  ?  thickness was normal.  ? ?EKG:  EKG performed 10/02/2019 demonstrates sinus bradycardia with marked first-degree AV block 258 ms.  When compared with EKG performed on today's date, the EKG demonstrates sinus bradycardia, prolonged PR 240 ms, and is otherwise normal.  No significant changes noted. ? ?Recent Labs: ?02/07/2021: BUN 32; Creatinine, Ser 1.24; Hemoglobin 14.6; Platelets 331; Potassium 4.6; Sodium 135  ?Recent Lipid Panel ?No results found for: CHOL, TRIG, HDL, CHOLHDL, VLDL, LDLCALC, LDLDIRECT ? ?Physical Exam:   ? ?VS:  Ht 6' (1.829 m)   BMI 35.94 kg/m?    ? ?Wt Readings from Last 3 Encounters:  ?02/07/21 265 lb (120.2 kg)  ?06/26/20 257 lb (116.6 kg)  ?05/27/20 254 lb (115.2 kg)  ?  ? ?GEN: He is overweight. No acute distress ?HEENT: Normal ?NECK: No JVD. ?LYMPHATICS: No lymphadenopathy ?CARDIAC: No murmur. RRR S4 gallop, or edema. ?VASCULAR: normal Pulses. No bruits. ?RESPIRATORY:  Clear to auscultation without rales, wheezing or rhonchi  ?ABDOMEN: Soft, non-tender, non-distended, No pulsatile mass, ?MUSCULOSKELETAL: No deformity  ?SKIN: Warm and dry ?NEUROLOGIC:  Alert and oriented x 3 ?PSYCHIATRIC:  Normal affect  ? ?ASSESSMENT:   ? ?1. Coronary artery disease of bypass graft of native  heart with stable angina pectoris ([place])   ?2. Chronic diastolic heart failure ([place])   ?3. On amiodarone therapy   ?4. OSA (obstructive sleep apnea)   ?5. Essential hypertension, benign   ?6. Chronic anticoagulation   ? ?PLAN:   ? ?In order of problems listed above: ? ?Secondary prevention discussed. ?No evidence of volume overload.  Continue current therapy.  He is on Jardiance recently increased to 25 mg.  He is also on Zestoretic.  2D Doppler echocardiogram to assess LV size function and diastolic parameters. ?Decrease amiodarone to 100 mg daily.  Most  recent TSH was 3.47 in October.  Liver tests were normal in June.  Liver and TSH will be done in 1 year.  If echo does not demonstrate any explanation for the patient's shortness of breath, Amio work-up needs to be done sooner. ?Continue with CPAP. ?Continue with Zestoretic and diltiazem. ?Continue Eliquis 5 mg twice daily.  Needs twice yearly CBC and creatinine. ? ? ? ? ?Medication Adjustments/Labs and Tests Ordered: ?Current medicines are reviewed at length with the patient today.  Concerns regarding medicines are outlined above.  ?No orders of the defined types were placed in this encounter. ? ?No orders of the defined types were placed in this encounter. ? ? ?There are no Patient Instructions on file for this visit.  ? ?Signed, ?[name], MD  ?08/05/2021 4:11 PM    ?[place] ?

## 2021-08-05 ENCOUNTER — Other Ambulatory Visit: Payer: Self-pay

## 2021-08-05 ENCOUNTER — Encounter: Payer: Self-pay | Admitting: Interventional Cardiology

## 2021-08-05 ENCOUNTER — Ambulatory Visit: Payer: PPO | Admitting: Interventional Cardiology

## 2021-08-05 VITALS — BP 104/70 | HR 74 | Ht 72.0 in | Wt 260.2 lb

## 2021-08-05 DIAGNOSIS — I5032 Chronic diastolic (congestive) heart failure: Secondary | ICD-10-CM | POA: Diagnosis not present

## 2021-08-05 DIAGNOSIS — G4733 Obstructive sleep apnea (adult) (pediatric): Secondary | ICD-10-CM

## 2021-08-05 DIAGNOSIS — Z7901 Long term (current) use of anticoagulants: Secondary | ICD-10-CM | POA: Diagnosis not present

## 2021-08-05 DIAGNOSIS — Z79899 Other long term (current) drug therapy: Secondary | ICD-10-CM

## 2021-08-05 DIAGNOSIS — I25708 Atherosclerosis of coronary artery bypass graft(s), unspecified, with other forms of angina pectoris: Secondary | ICD-10-CM

## 2021-08-05 DIAGNOSIS — I10 Essential (primary) hypertension: Secondary | ICD-10-CM | POA: Diagnosis not present

## 2021-08-05 MED ORDER — AMIODARONE HCL 200 MG PO TABS: 100.0000 mg | ORAL_TABLET | Freq: Every day | ORAL | 3 refills | Status: DC

## 2021-08-05 NOTE — Patient Instructions (Addendum)
Medication Instructions:  ?1) DECREASE Amiodarone to '100mg'$  once daily.  You will take a half tablet of the '200mg'$  tablets you have at home.  ? ?*If you need a refill on your cardiac medications before your next appointment, please call your pharmacy* ? ? ?Lab Work: ?Liver, TSH, BMET and CBC today ? ?If you have labs (blood work) drawn today and your tests are completely normal, you will receive your results only by: ?MyChart Message (if you have MyChart) OR ?A paper copy in the mail ?If you have any lab test that is abnormal or we need to change your treatment, we will call you to review the results. ? ? ?Testing/Procedures: ?Your physician has requested that you have an echocardiogram. Echocardiography is a painless test that uses sound waves to create images of your heart. It provides your doctor with information about the size and shape of your heart and how well your heart?s chambers and valves are working. This procedure takes approximately one hour. There are no restrictions for this procedure. ? ? ? ?Follow-Up: ?At [facility], you and your health needs are our priority.  As part of our continuing mission to provide you with exceptional heart care, we have created designated Provider Care Teams.  These Care Teams include your primary Cardiologist (physician) and Advanced Practice Providers (APPs -  Physician Assistants and Nurse Practitioners) who all work together to provide you with the care you need, when you need it. ? ?We recommend signing up for the patient portal called "MyChart".  Sign up information is provided on this After Visit Summary.  MyChart is used to connect with patients for Virtual Visits (Telemedicine).  Patients are able to view lab/test results, encounter notes, upcoming appointments, etc.  Non-urgent messages can be sent to your provider as well.   ?To learn more about what you can do with MyChart, go to [email or web address].   ? ?Your next appointment:   ?1 year(s) ? ?The format  for your next appointment:   ?In Person ? ?Provider:   ?[name], MD   ? ? ? ?

## 2021-08-06 ENCOUNTER — Other Ambulatory Visit: Payer: Self-pay | Admitting: *Deleted

## 2021-08-06 DIAGNOSIS — M1811 Unilateral primary osteoarthritis of first carpometacarpal joint, right hand: Secondary | ICD-10-CM | POA: Diagnosis not present

## 2021-08-06 DIAGNOSIS — E0590 Thyrotoxicosis, unspecified without thyrotoxic crisis or storm: Secondary | ICD-10-CM

## 2021-08-06 DIAGNOSIS — M1812 Unilateral primary osteoarthritis of first carpometacarpal joint, left hand: Secondary | ICD-10-CM | POA: Diagnosis not present

## 2021-08-06 LAB — CBC
Hematocrit: 37.4 % — ABNORMAL LOW (ref 37.5–51.0)
Hemoglobin: 12.7 g/dL — ABNORMAL LOW (ref 13.0–17.7)
MCH: 29.8 pg (ref 26.6–33.0)
MCHC: 34 g/dL (ref 31.5–35.7)
MCV: 88 fL (ref 79–97)
Platelets: 264 10*3/uL (ref 150–450)
RBC: 4.26 x10E6/uL (ref 4.14–5.80)
RDW: 13.6 % (ref 11.6–15.4)
WBC: 13.8 10*3/uL — ABNORMAL HIGH (ref 3.4–10.8)

## 2021-08-06 LAB — HEPATIC FUNCTION PANEL
ALT: 19 IU/L (ref 0–44)
AST: 19 IU/L (ref 0–40)
Albumin: 5 g/dL — ABNORMAL HIGH (ref 3.7–4.7)
Alkaline Phosphatase: 53 IU/L (ref 44–121)
Bilirubin Total: 0.4 mg/dL (ref 0.0–1.2)
Bilirubin, Direct: 0.15 mg/dL (ref 0.00–0.40)
Total Protein: 7.7 g/dL (ref 6.0–8.5)

## 2021-08-06 LAB — BASIC METABOLIC PANEL
BUN/Creatinine Ratio: 28 — ABNORMAL HIGH (ref 10–24)
BUN: 36 mg/dL — ABNORMAL HIGH (ref 8–27)
CO2: 17 mmol/L — ABNORMAL LOW (ref 20–29)
Calcium: 10.6 mg/dL — ABNORMAL HIGH (ref 8.6–10.2)
Chloride: 100 mmol/L (ref 96–106)
Creatinine, Ser: 1.29 mg/dL — ABNORMAL HIGH (ref 0.76–1.27)
Glucose: 167 mg/dL — ABNORMAL HIGH (ref 70–99)
Potassium: 4.9 mmol/L (ref 3.5–5.2)
Sodium: 137 mmol/L (ref 134–144)
eGFR: 57 mL/min/{1.73_m2} — ABNORMAL LOW (ref >59)

## 2021-08-06 LAB — TSH: TSH: 0.185 u[IU]/mL — ABNORMAL LOW (ref 0.450–4.500)

## 2021-08-19 ENCOUNTER — Ambulatory Visit (HOSPITAL_COMMUNITY): Payer: PPO | Attending: Cardiology

## 2021-08-19 ENCOUNTER — Other Ambulatory Visit: Payer: Self-pay

## 2021-08-19 DIAGNOSIS — Z7901 Long term (current) use of anticoagulants: Secondary | ICD-10-CM | POA: Insufficient documentation

## 2021-08-19 DIAGNOSIS — I5032 Chronic diastolic (congestive) heart failure: Secondary | ICD-10-CM | POA: Diagnosis not present

## 2021-08-19 DIAGNOSIS — I25708 Atherosclerosis of coronary artery bypass graft(s), unspecified, with other forms of angina pectoris: Secondary | ICD-10-CM | POA: Diagnosis not present

## 2021-08-19 DIAGNOSIS — I10 Essential (primary) hypertension: Secondary | ICD-10-CM | POA: Diagnosis not present

## 2021-08-19 DIAGNOSIS — Z79899 Other long term (current) drug therapy: Secondary | ICD-10-CM | POA: Diagnosis not present

## 2021-08-19 DIAGNOSIS — G4733 Obstructive sleep apnea (adult) (pediatric): Secondary | ICD-10-CM | POA: Insufficient documentation

## 2021-08-19 LAB — ECHOCARDIOGRAM COMPLETE
AR max vel: 1.97 cm2
AV Area VTI: 2 cm2
AV Area mean vel: 1.9 cm2
AV Mean grad: 7 mmHg
AV Peak grad: 11.7 mmHg
Ao pk vel: 1.71 m/s
Area-P 1/2: 3.68 cm2
S' Lateral: 2.5 cm

## 2021-08-26 DIAGNOSIS — E1169 Type 2 diabetes mellitus with other specified complication: Secondary | ICD-10-CM | POA: Diagnosis not present

## 2021-08-26 DIAGNOSIS — I10 Essential (primary) hypertension: Secondary | ICD-10-CM | POA: Diagnosis not present

## 2021-08-26 DIAGNOSIS — G8929 Other chronic pain: Secondary | ICD-10-CM | POA: Diagnosis not present

## 2021-08-26 DIAGNOSIS — E1165 Type 2 diabetes mellitus with hyperglycemia: Secondary | ICD-10-CM | POA: Diagnosis not present

## 2021-09-02 ENCOUNTER — Other Ambulatory Visit: Payer: PPO | Admitting: *Deleted

## 2021-09-02 DIAGNOSIS — E0590 Thyrotoxicosis, unspecified without thyrotoxic crisis or storm: Secondary | ICD-10-CM

## 2021-09-03 LAB — TSH: TSH: 0.934 u[IU]/mL (ref 0.450–4.500)

## 2021-09-09 DIAGNOSIS — M1812 Unilateral primary osteoarthritis of first carpometacarpal joint, left hand: Secondary | ICD-10-CM | POA: Diagnosis not present

## 2021-09-09 DIAGNOSIS — M1811 Unilateral primary osteoarthritis of first carpometacarpal joint, right hand: Secondary | ICD-10-CM | POA: Diagnosis not present

## 2021-09-15 ENCOUNTER — Other Ambulatory Visit: Payer: Self-pay | Admitting: Interventional Cardiology

## 2021-09-23 DIAGNOSIS — R3915 Urgency of urination: Secondary | ICD-10-CM | POA: Diagnosis not present

## 2021-09-23 DIAGNOSIS — R21 Rash and other nonspecific skin eruption: Secondary | ICD-10-CM | POA: Diagnosis not present

## 2021-09-26 DIAGNOSIS — E7800 Pure hypercholesterolemia, unspecified: Secondary | ICD-10-CM | POA: Diagnosis not present

## 2021-09-26 DIAGNOSIS — E1169 Type 2 diabetes mellitus with other specified complication: Secondary | ICD-10-CM | POA: Diagnosis not present

## 2021-09-26 DIAGNOSIS — I480 Paroxysmal atrial fibrillation: Secondary | ICD-10-CM | POA: Diagnosis not present

## 2021-09-26 DIAGNOSIS — G8929 Other chronic pain: Secondary | ICD-10-CM | POA: Diagnosis not present

## 2021-09-26 DIAGNOSIS — I10 Essential (primary) hypertension: Secondary | ICD-10-CM | POA: Diagnosis not present

## 2021-11-28 DIAGNOSIS — F1190 Opioid use, unspecified, uncomplicated: Secondary | ICD-10-CM | POA: Diagnosis not present

## 2021-11-28 DIAGNOSIS — E1169 Type 2 diabetes mellitus with other specified complication: Secondary | ICD-10-CM | POA: Diagnosis not present

## 2021-11-28 DIAGNOSIS — D6869 Other thrombophilia: Secondary | ICD-10-CM | POA: Diagnosis not present

## 2021-11-28 DIAGNOSIS — M19071 Primary osteoarthritis, right ankle and foot: Secondary | ICD-10-CM | POA: Diagnosis not present

## 2021-11-28 DIAGNOSIS — I10 Essential (primary) hypertension: Secondary | ICD-10-CM | POA: Diagnosis not present

## 2021-11-28 DIAGNOSIS — G4733 Obstructive sleep apnea (adult) (pediatric): Secondary | ICD-10-CM | POA: Diagnosis not present

## 2021-11-28 DIAGNOSIS — M5136 Other intervertebral disc degeneration, lumbar region: Secondary | ICD-10-CM | POA: Diagnosis not present

## 2021-11-28 DIAGNOSIS — Z1331 Encounter for screening for depression: Secondary | ICD-10-CM | POA: Diagnosis not present

## 2021-11-28 DIAGNOSIS — Z0000 Encounter for general adult medical examination without abnormal findings: Secondary | ICD-10-CM | POA: Diagnosis not present

## 2021-11-28 DIAGNOSIS — I2510 Atherosclerotic heart disease of native coronary artery without angina pectoris: Secondary | ICD-10-CM | POA: Diagnosis not present

## 2021-11-28 DIAGNOSIS — E7800 Pure hypercholesterolemia, unspecified: Secondary | ICD-10-CM | POA: Diagnosis not present

## 2021-11-28 DIAGNOSIS — I480 Paroxysmal atrial fibrillation: Secondary | ICD-10-CM | POA: Diagnosis not present

## 2021-11-28 DIAGNOSIS — Z794 Long term (current) use of insulin: Secondary | ICD-10-CM | POA: Diagnosis not present

## 2022-02-11 DIAGNOSIS — M19071 Primary osteoarthritis, right ankle and foot: Secondary | ICD-10-CM | POA: Diagnosis not present

## 2022-03-23 DIAGNOSIS — I480 Paroxysmal atrial fibrillation: Secondary | ICD-10-CM | POA: Diagnosis not present

## 2022-03-23 DIAGNOSIS — E1169 Type 2 diabetes mellitus with other specified complication: Secondary | ICD-10-CM | POA: Diagnosis not present

## 2022-03-23 DIAGNOSIS — E7800 Pure hypercholesterolemia, unspecified: Secondary | ICD-10-CM | POA: Diagnosis not present

## 2022-03-23 DIAGNOSIS — M5136 Other intervertebral disc degeneration, lumbar region: Secondary | ICD-10-CM | POA: Diagnosis not present

## 2022-03-23 DIAGNOSIS — I10 Essential (primary) hypertension: Secondary | ICD-10-CM | POA: Diagnosis not present

## 2022-03-23 DIAGNOSIS — Z23 Encounter for immunization: Secondary | ICD-10-CM | POA: Diagnosis not present

## 2022-03-23 DIAGNOSIS — Z1211 Encounter for screening for malignant neoplasm of colon: Secondary | ICD-10-CM | POA: Diagnosis not present

## 2022-03-23 DIAGNOSIS — G4733 Obstructive sleep apnea (adult) (pediatric): Secondary | ICD-10-CM | POA: Diagnosis not present

## 2022-03-23 DIAGNOSIS — E669 Obesity, unspecified: Secondary | ICD-10-CM | POA: Diagnosis not present

## 2022-03-25 DIAGNOSIS — I10 Essential (primary) hypertension: Secondary | ICD-10-CM | POA: Diagnosis not present

## 2022-03-25 DIAGNOSIS — E1169 Type 2 diabetes mellitus with other specified complication: Secondary | ICD-10-CM | POA: Diagnosis not present

## 2022-03-25 DIAGNOSIS — E7800 Pure hypercholesterolemia, unspecified: Secondary | ICD-10-CM | POA: Diagnosis not present

## 2022-03-25 DIAGNOSIS — G8929 Other chronic pain: Secondary | ICD-10-CM | POA: Diagnosis not present

## 2022-04-01 DIAGNOSIS — M19072 Primary osteoarthritis, left ankle and foot: Secondary | ICD-10-CM | POA: Diagnosis not present

## 2022-04-13 DIAGNOSIS — Z87891 Personal history of nicotine dependence: Secondary | ICD-10-CM | POA: Diagnosis not present

## 2022-04-13 DIAGNOSIS — Z794 Long term (current) use of insulin: Secondary | ICD-10-CM | POA: Diagnosis not present

## 2022-04-13 DIAGNOSIS — Z6835 Body mass index (BMI) 35.0-35.9, adult: Secondary | ICD-10-CM | POA: Diagnosis not present

## 2022-04-13 DIAGNOSIS — Z7984 Long term (current) use of oral hypoglycemic drugs: Secondary | ICD-10-CM | POA: Diagnosis not present

## 2022-04-13 DIAGNOSIS — I10 Essential (primary) hypertension: Secondary | ICD-10-CM | POA: Diagnosis not present

## 2022-04-13 DIAGNOSIS — E785 Hyperlipidemia, unspecified: Secondary | ICD-10-CM | POA: Diagnosis not present

## 2022-04-13 DIAGNOSIS — Z7985 Long-term (current) use of injectable non-insulin antidiabetic drugs: Secondary | ICD-10-CM | POA: Diagnosis not present

## 2022-04-13 DIAGNOSIS — E119 Type 2 diabetes mellitus without complications: Secondary | ICD-10-CM | POA: Diagnosis not present

## 2022-05-12 ENCOUNTER — Telehealth: Payer: Self-pay

## 2022-05-12 NOTE — Telephone Encounter (Signed)
...     Pre-operative Risk Assessment    Patient Name: Joshua Terrell  DOB: September 28, 1945 MRN: [id number]      Request for Surgical Clearance    Procedure:   COLONOSCOPY  Date of Surgery:  Clearance 07/01/22                                 Surgeon:  DR Paulita Fujita Surgeon's Group or Practice Name:  [facility] Phone number:  [phone] Fax number:  [phone]   Type of Clearance Requested:   - Medical  - Pharmacy:  Hold Apixaban (Eliquis) HOLD FOR 1-2 DAYS PRIOR TO COLONOSCOPY   Type of Anesthesia:   PROPOFOL   Additional requests/questions:    Gwenlyn Found   05/12/2022, 12:15 PM

## 2022-05-13 ENCOUNTER — Telehealth: Payer: Self-pay | Admitting: *Deleted

## 2022-05-13 NOTE — Telephone Encounter (Signed)
Pt has appt 06/12/22. Procedure is 07/01/22

## 2022-05-13 NOTE — Telephone Encounter (Signed)
Patient with diagnosis of aflutter on Eliquis for anticoagulation.    Procedure: colonoscopy Date of procedure: 07/01/22  CHA2DS2-VASc Score = 6  This indicates a 9.7% annual risk of stroke. The patient's score is based upon: CHF History: 1 HTN History: 1 Diabetes History: 1 Stroke History: 0 Vascular Disease History: 1 Age Score: 2 Gender Score: 0  CrCl 24m/min using adjusted body weight Platelet count 264K  Per office protocol, patient can hold Eliquis for 1-2 days prior to procedure.    **This guidance is not considered finalized until pre-operative APP has relayed final recommendations.**

## 2022-05-13 NOTE — Telephone Encounter (Signed)
I s/w the pt and he is set for a tele pre op appt 06/12/22 @ 2 pm. Med rec and consent are done.     Patient Consent for Virtual Visit        Joshua Terrell has provided verbal consent on 05/13/2022 for a virtual visit (video or telephone).   CONSENT FOR VIRTUAL VISIT FOR:  Joshua Terrell  By participating in this virtual visit I agree to the following:  I hereby voluntarily request, consent and authorize [facility] and its employed or contracted physicians, physician assistants, nurse practitioners or other licensed health care professionals (the Practitioner), to provide me with telemedicine health care services (the "Services") as deemed necessary by the treating Practitioner. I acknowledge and consent to receive the Services by the Practitioner via telemedicine. I understand that the telemedicine visit will involve communicating with the Practitioner through live audiovisual communication technology and the disclosure of certain medical information by electronic transmission. I acknowledge that I have been given the opportunity to request an in-person assessment or other available alternative prior to the telemedicine visit and am voluntarily participating in the telemedicine visit.  I understand that I have the right to withhold or withdraw my consent to the use of telemedicine in the course of my care at any time, without affecting my right to future care or treatment, and that the Practitioner or I may terminate the telemedicine visit at any time. I understand that I have the right to inspect all information obtained and/or recorded in the course of the telemedicine visit and may receive copies of available information for a reasonable fee.  I understand that some of the potential risks of receiving the Services via telemedicine include:  Delay or interruption in medical evaluation due to technological equipment failure or disruption; Information transmitted may not be sufficient  (e.g. poor resolution of images) to allow for appropriate medical decision making by the Practitioner; and/or  In rare instances, security protocols could fail, causing a breach of personal health information.  Furthermore, I acknowledge that it is my responsibility to provide information about my medical history, conditions and care that is complete and accurate to the best of my ability. I acknowledge that Practitioner's advice, recommendations, and/or decision may be based on factors not within their control, such as incomplete or inaccurate data provided by me or distortions of diagnostic images or specimens that may result from electronic transmissions. I understand that the practice of medicine is not an exact science and that Practitioner makes no warranties or guarantees regarding treatment outcomes. I acknowledge that a copy of this consent can be made available to me via my patient portal ([place]), or I can request a printed copy by calling the office of [facility].    I understand that my insurance will be billed for this visit.   I have read or had this consent read to me. I understand the contents of this consent, which adequately explains the benefits and risks of the Services being provided via telemedicine.  I have been provided ample opportunity to ask questions regarding this consent and the Services and have had my questions answered to my satisfaction. I give my informed consent for the services to be provided through the use of telemedicine in my medical care

## 2022-05-13 NOTE — Telephone Encounter (Signed)
I s/w the pt and he is set for a tele pre op appt 06/12/22 @ 2 pm. Med rec and consent are done.

## 2022-05-13 NOTE — Telephone Encounter (Signed)
   Name: Joshua Terrell  DOB: 1945-10-20  MRN: [id number]  Primary Cardiologist: [name], MD  Chart reviewed as part of pre-operative protocol coverage. Because of Joshua Terrell's past medical history and time since last visit, he will require a follow-up telephone visit in order to better assess preoperative cardiovascular risk.  Pre-op covering staff: - Please schedule appointment and call patient to inform them. If patient already had an upcoming appointment within acceptable timeframe, please add "pre-op clearance" to the appointment notes so provider is aware. - Please contact requesting surgeon's office via preferred method (i.e, phone, fax) to inform them of need for appointment prior to surgery.  Per office protocol, patient can hold Eliquis for 1-2 days prior to procedure.     Elgie Collard, PA-C  05/13/2022, 4:51 PM

## 2022-05-13 NOTE — Telephone Encounter (Signed)
Patient on Eliquis for atrial flutter. Will route to pharmacy team for input.    CHA2DS2-VASc Score = 6   This indicates a 9.7% annual risk of stroke. The patient's score is based upon: CHF History: 1 HTN History: 1 Diabetes History: 1 Stroke History: 0 Vascular Disease History: 1 Age Score: 2 Gender Score: 0     Platelet count: 08/05/2021: Platelets 264   Creatinine clearance: 64 mL/min (adjusted for weight)  11/28/21 creatinine 1.27, GFR 58, 08/05/2021: Creatinine, Ser 1.29; Hemoglobin 12.7     Loel Dubonnet, NP  05/13/22  9:30 AM

## 2022-05-13 NOTE — Telephone Encounter (Signed)
   Name: Joshua Terrell  DOB: [birth year]  MRN: [id number]  Primary Cardiologist: [name], MD  Chart reviewed as part of pre-operative protocol coverage. Because of Vineeth Fell Mckibben's past medical history and time since last visit, he will require a follow-up virtual office visit in order to better assess preoperative cardiovascular risk. Please schedule pt in the first 2 weeks of January.  Pre-op covering staff: - Please schedule appointment and call patient to inform them. If patient already had an upcoming appointment within acceptable timeframe, please add "pre-op clearance" to the appointment notes so provider is aware. - Please contact requesting surgeon's office via preferred method (i.e, phone, fax) to inform them of need for appointment prior to surgery.  Loel Dubonnet, NP  05/13/2022, 9:30 AM

## 2022-05-20 DIAGNOSIS — G8929 Other chronic pain: Secondary | ICD-10-CM | POA: Diagnosis not present

## 2022-05-20 DIAGNOSIS — I10 Essential (primary) hypertension: Secondary | ICD-10-CM | POA: Diagnosis not present

## 2022-05-20 DIAGNOSIS — E7800 Pure hypercholesterolemia, unspecified: Secondary | ICD-10-CM | POA: Diagnosis not present

## 2022-05-20 DIAGNOSIS — E1169 Type 2 diabetes mellitus with other specified complication: Secondary | ICD-10-CM | POA: Diagnosis not present

## 2022-06-08 DIAGNOSIS — M67911 Unspecified disorder of synovium and tendon, right shoulder: Secondary | ICD-10-CM | POA: Diagnosis not present

## 2022-06-11 NOTE — Progress Notes (Signed)
Virtual Visit via Telephone Note   Because of Joshua Terrell's co-morbid illnesses, he is at least at moderate risk for complications without adequate follow up.  This format is felt to be most appropriate for this patient at this time.  The patient did not have access to video technology/had technical difficulties with video requiring transitioning to audio format only (telephone).  All issues noted in this document were discussed and addressed.  No physical exam could be performed with this format.  Please refer to the patient's chart for his consent to telehealth for [facility].  Evaluation Performed:  Preoperative cardiovascular risk assessment _____________   Date:  06/11/2022   Patient ID:  Joshua Terrell, DOB June 12, 1945, MRN [id number] Patient Location:  Home Provider location:   Office  Primary Care Provider:  [name], MD Primary Cardiologist:  [name], MD  Chief Complaint / Patient Profile   77 y.o. 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  History of Present Illness    Joshua Terrell is a 77 y.o. male who presents via audio/video conferencing for a telehealth visit today.  Pt was last seen in cardiology clinic on 08/05/21 by Dr. Tamala Julian. At that time Joshua Terrell was experiencing worsening shortness of breath. He underwent 2D echo which revealed normal heart and valve function. Suggestion was made to further evaluate for amiodarone toxicity if DOE persists. The patient is now pending procedure as outlined above. Since his last visit, he  denies chest pain, shortness of breath, lower extremity edema, fatigue, palpitations, melena, hematuria, hemoptysis, diaphoresis, weakness, presyncope, syncope, orthopnea, and PND.   Past Medical History    Past Medical History:   Diagnosis Date   Coronary artery disease    PTCA of the codominant LAD, 1992, CABG July 94   Diabetes mellitus without complication ([place])    Diverticulosis    Erectile dysfunction    HTN (hypertension)    Hyperlipidemia    Myocardial infarction ([place])    Obesity    OSA (obstructive sleep apnea)    Tinnitus    Past Surgical History:  Procedure Laterality Date   CORONARY ANGIOPLASTY     CORONARY ARTERY BYPASS GRAFT     VASECTOMY      Allergies  No Known Allergies  Home Medications    Prior to Admission medications   Medication Sig Start Date End Date Taking? Authorizing Provider  amiodarone (PACERONE) 200 MG tablet Take 0.5 tablets (100 mg total) by mouth daily. 08/05/21   Belva Crome, MD  apixaban (ELIQUIS) 5 MG TABS tablet Take 5 mg by mouth 2 (two) times daily.    [provider]  atorvastatin (LIPITOR) 80 MG tablet Take 80 mg by mouth daily.  01/03/13   [provider]  diltiazem (CARDIZEM CD) 180 MG 24 hr capsule TAKE 1 CAPSULE BY MOUTH DAILY 09/15/21   Belva Crome, MD  Dulaglutide (TRULICITY) 3 EQ/6.8TM SOPN Inject 3 mg into the skin once a week.    [provider]  DULoxetine (CYMBALTA) 60 MG capsule 1 capsule 09/30/20   [provider]  empagliflozin (JARDIANCE) 25 MG TABS tablet Take 25 mg by mouth daily.    [provider]  insulin NPH Human (NOVOLIN N) 100 UNIT/ML injection Inject 40 units into the skin each morning and  45 units into the skin each evening.    [provider]  lisinopril-hydrochlorothiazide (PRINZIDE,ZESTORETIC) 20-12.5 MG tablet Take 1 tablet by mouth daily. 09/26/15   [provider]  meloxicam (MOBIC) 15 MG tablet Take 15 mg by mouth daily. 09/23/19   [provider]  metFORMIN (GLUCOPHAGE) 1000 MG tablet Take 500 mg by mouth 2 (two) times daily with a meal. Pt takes 2 tablets 500 mg in the morning and 2 tablet 500 mg at night.    [provider]  Multiple Vitamin  (MULTIVITAMIN WITH MINERALS) TABS tablet Take 1 tablet by mouth daily.    [provider]  nitroGLYCERIN (NITROSTAT) 0.4 MG SL tablet Place 1 tablet (0.4 mg total) under the tongue every 5 (five) minutes as needed for chest pain. 05/15/20   Belva Crome, MD  oxyCODONE-acetaminophen (PERCOCET/ROXICET) 5-325 MG tablet Take 1 tablet by mouth at bedtime as needed (pain).  08/22/15   [provider]    Physical Exam    Vital Signs:  Joshua Terrell does not have vital signs available for review today.  Given telephonic nature of communication, physical exam is limited. AAOx3. NAD. Normal affect.  Speech and respirations are unlabored.  Accessory Clinical Findings    None  Assessment & Plan    1.  Preoperative Cardiovascular Risk Assessment: The patient is doing well from a cardiac perspective. Therefore, based on ACC/AHA guidelines, the patient would be at acceptable risk for the planned procedure without further cardiovascular testing. According to the Revised Cardiac Risk Index (RCRI), his Perioperative Risk of Major Cardiac Event is (%): 6.6.  His Functional Capacity in METs is: 5.93 according to the Duke Activity Status Index (DASI).   The patient was advised that if he develops new symptoms prior to surgery to contact our office to arrange for a follow-up visit, and he verbalized understanding.  Per office protocol, patient can hold Eliquis for 1-2 days prior to procedure.     A copy of this note will be routed to requesting surgeon.  Time:   Today, I have spent 8 minutes with the patient with telehealth technology discussing medical history, symptoms, and management plan.     Emmaline Life, NP-C  06/12/2022, 1:56 PM [address], [address] Office [phone] Fax [phone]

## 2022-06-12 ENCOUNTER — Ambulatory Visit: Payer: PPO | Attending: Cardiovascular Disease | Admitting: Nurse Practitioner

## 2022-06-12 ENCOUNTER — Encounter: Payer: Self-pay | Admitting: Nurse Practitioner

## 2022-06-12 DIAGNOSIS — Z01810 Encounter for preprocedural cardiovascular examination: Secondary | ICD-10-CM | POA: Diagnosis not present

## 2022-06-24 DIAGNOSIS — H52203 Unspecified astigmatism, bilateral: Secondary | ICD-10-CM | POA: Diagnosis not present

## 2022-06-24 DIAGNOSIS — E119 Type 2 diabetes mellitus without complications: Secondary | ICD-10-CM | POA: Diagnosis not present

## 2022-06-24 DIAGNOSIS — H4720 Unspecified optic atrophy: Secondary | ICD-10-CM | POA: Diagnosis not present

## 2022-06-26 IMAGING — MR MR ORBITS WO/W CM
4 of 8 series · 11 of 48 positions shown · IV contrast (10 ML GAD)
Comparison: None.

CLINICAL DATA: Ocular pain with optic nerve edema

EXAM:
MRI OF THE ORBITS WITHOUT AND WITH CONTRAST
TECHNIQUE: Multiplanar, multisequence MR imaging of the orbits was performed
both before and after the administration of intravenous contrast.
CONTRAST:  10mL GADAVIST GADOBUTROL 1 MMOL/ML IV SOLN

[Series 2: FLAIR · sagittal · 5.0mm · 0.47mm/px · 3 of 26 slices shown]
[im 4/26]
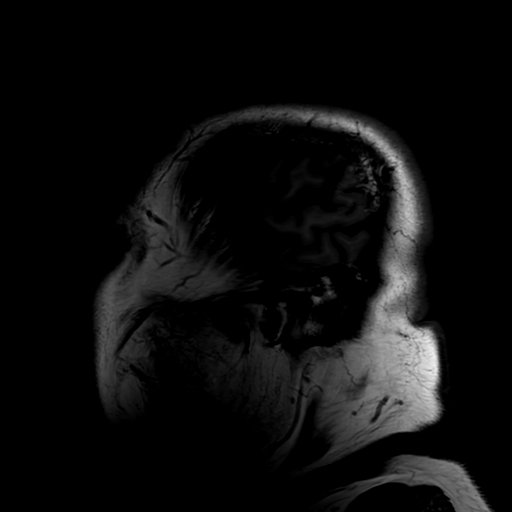
[im 15/26]
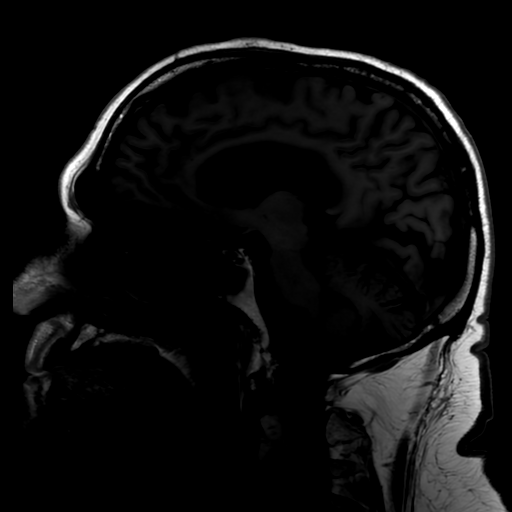
[im 22/26]
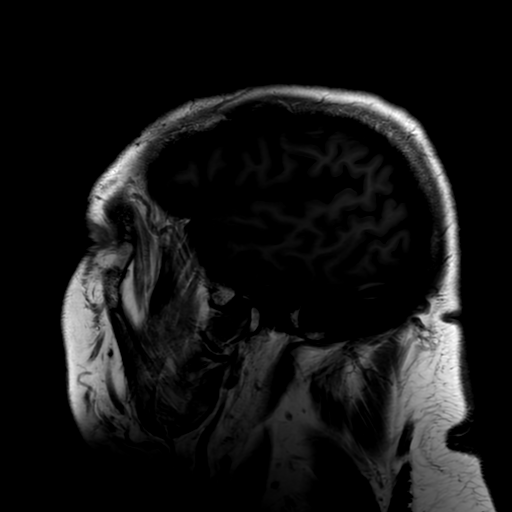

[Series 3: T2 · axial · 5.0mm · 0.23mm/px · z∈[-12,+83]mm · 3 of 26 slices shown]
[im 5/26]
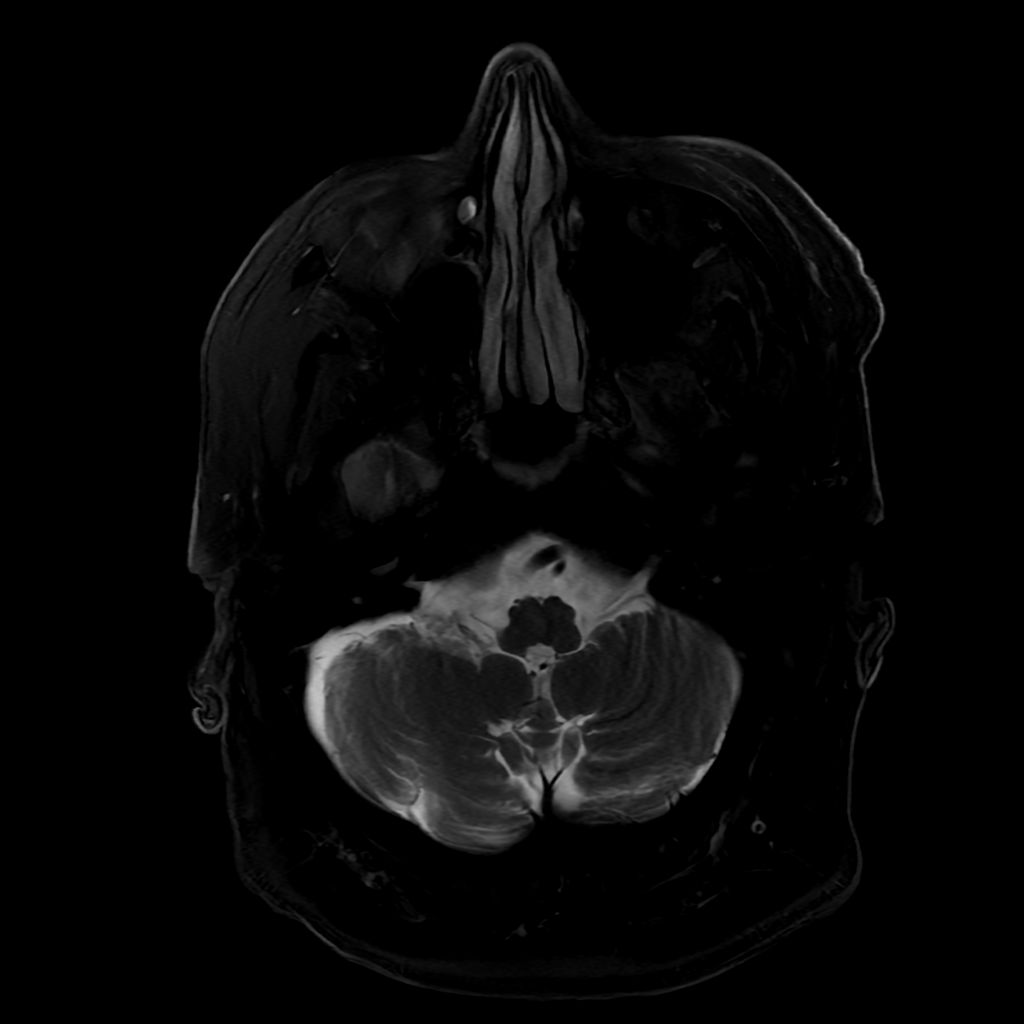
[im 13/26]
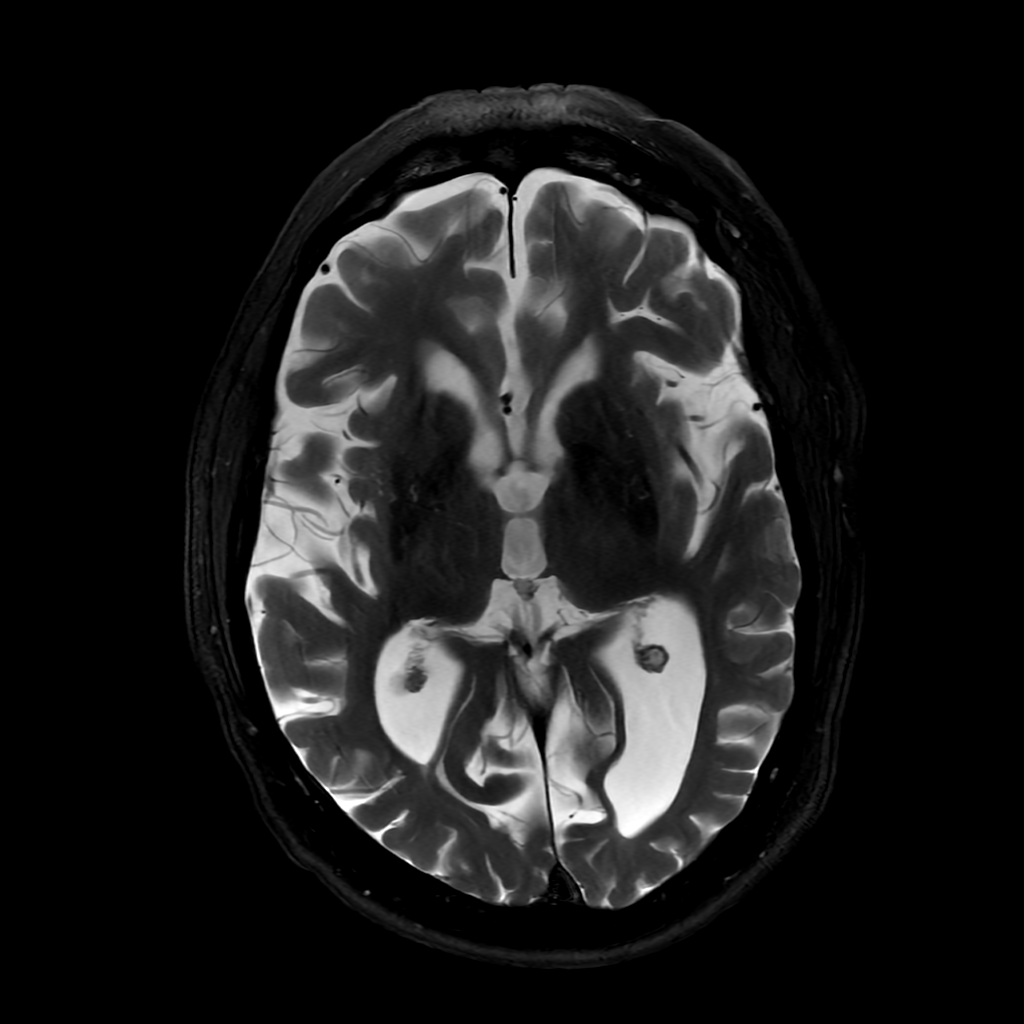
[im 21/26]
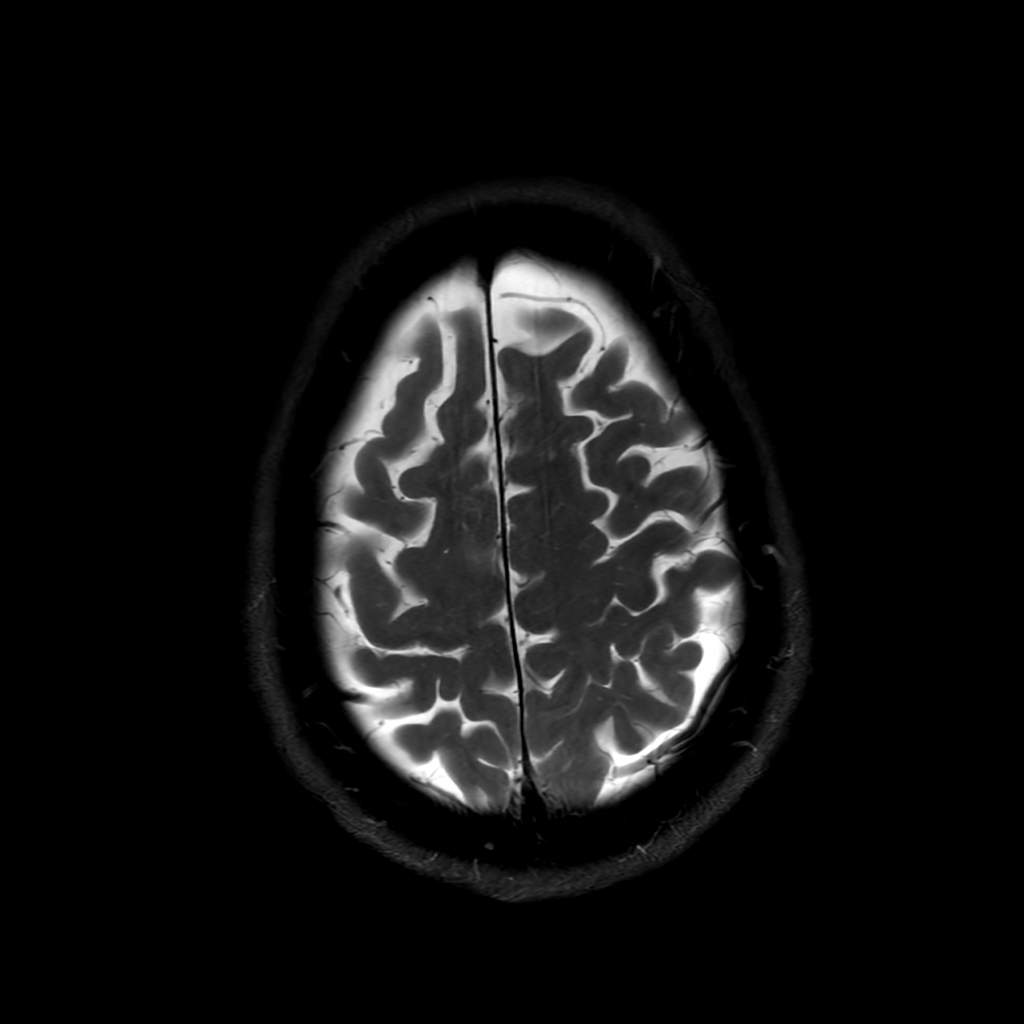

[Series 4: T2 fat-sat · coronal · 4.0mm · 0.35mm/px · 3 of 22 slices shown (1 of 2)]
[im 5/22]
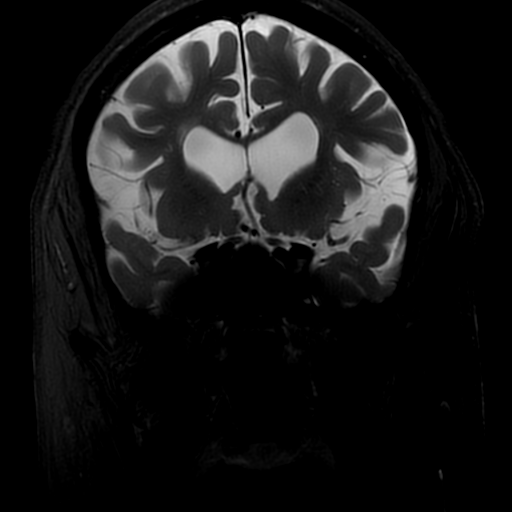
[im 13/22]
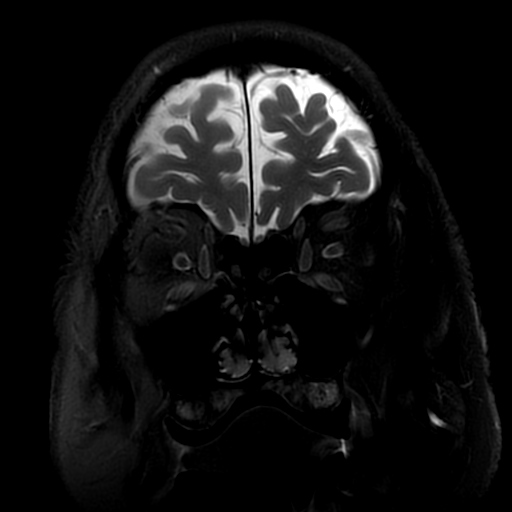
[im 22/22]
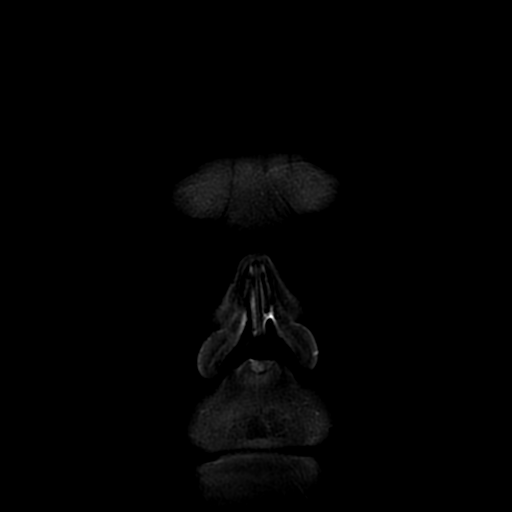

[Series 5: T2 fat-sat · axial · 3.0mm · 0.35mm/px · z∈[-42,-15]mm · 2 of 20 slices shown (2 of 2)]
[im 1/20]
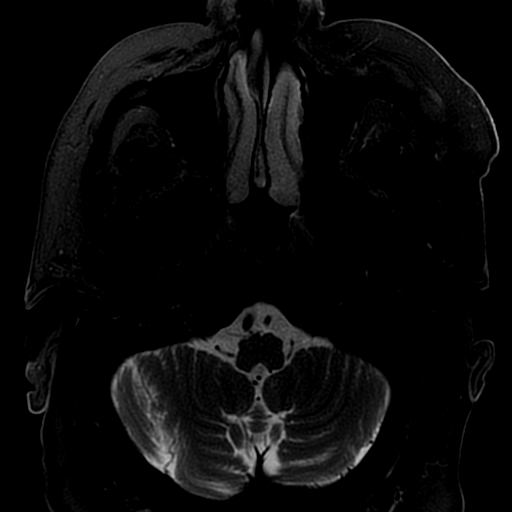
[im 10/20]
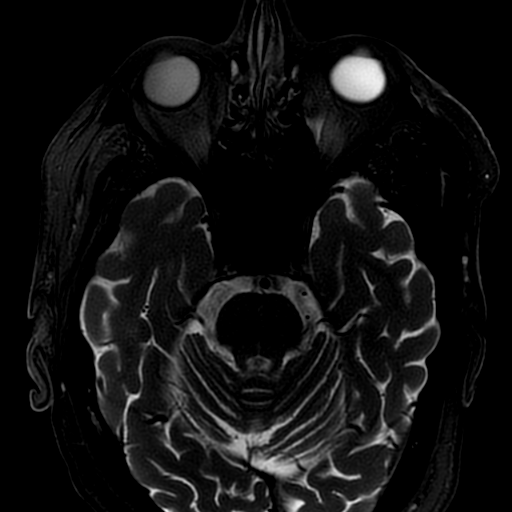

[11 of 48 positions shown; findings below may reference images not displayed]

FINDINGS: Orbits:

--Globes: Bilateral lens replacements.

--Bony orbit: Normal.

--Preseptal soft tissues: Normal.

--Intra- and extraconal orbital fat: Normal. No inflammatory
stranding.

--Optic nerves: Postcontrast images are degraded by motion but there
is no abnormal contrast enhancement visible along the optic nerves.

--Lacrimal glands and fossae: Normal.

--Extraocular muscles: Normal.

Visualized sinuses:  No fluid levels or advanced mucosal thickening.

Soft tissues: Normal.

Limited intracranial: Generalized atrophy.
IMPRESSION: Motion degraded study but no visible orbital abnormality.

## 2022-07-01 DIAGNOSIS — K5730 Diverticulosis of large intestine without perforation or abscess without bleeding: Secondary | ICD-10-CM | POA: Diagnosis not present

## 2022-07-01 DIAGNOSIS — K648 Other hemorrhoids: Secondary | ICD-10-CM | POA: Diagnosis not present

## 2022-07-01 DIAGNOSIS — Z1211 Encounter for screening for malignant neoplasm of colon: Secondary | ICD-10-CM | POA: Diagnosis not present

## 2022-07-01 DIAGNOSIS — D123 Benign neoplasm of transverse colon: Secondary | ICD-10-CM | POA: Diagnosis not present

## 2022-08-31 ENCOUNTER — Other Ambulatory Visit: Payer: Self-pay

## 2022-08-31 MED ORDER — DILTIAZEM HCL ER COATED BEADS 180 MG PO CP24: 180.0000 mg | ORAL_CAPSULE | Freq: Every day | ORAL | 3 refills | Status: DC

## 2022-09-09 ENCOUNTER — Encounter: Payer: Self-pay | Admitting: Cardiology

## 2022-09-09 ENCOUNTER — Ambulatory Visit: Payer: PPO | Attending: Cardiology | Admitting: Cardiology

## 2022-09-09 VITALS — BP 142/80 | HR 61 | Ht 72.0 in | Wt 259.2 lb

## 2022-09-09 DIAGNOSIS — Z79899 Other long term (current) drug therapy: Secondary | ICD-10-CM

## 2022-09-09 DIAGNOSIS — I5032 Chronic diastolic (congestive) heart failure: Secondary | ICD-10-CM

## 2022-09-09 DIAGNOSIS — I10 Essential (primary) hypertension: Secondary | ICD-10-CM

## 2022-09-09 DIAGNOSIS — I4892 Unspecified atrial flutter: Secondary | ICD-10-CM

## 2022-09-09 NOTE — Progress Notes (Signed)
Cardiology Office Note:    Date:  09/09/2022   ID:  Joshua Terrell, DOB 10/28/1945, MRN [id number]  PCP:  Kirby Funk, MD   [facility] Providers Cardiologist:  Donato Schultz, MD     Referring MD: Kirby Funk, MD    History of Present Illness:    Joshua Terrell is a 77 y.o. male former patient of Dr. Verdis Prime with coronary disease status post CABG in 1994  SVG to OM, SVG to RCA, and SVG to diagonal. LIMA to LAD is atretic with atypical atrial flutter on chronic anticoagulation as well as antiarrhythmic with hypertension obstructive sleep apnea on CPAP and hyperlipidemia.  Overall doing well.  Did have some experience of shortness of breath previously.  Echocardiogram showed normal pump function and valvular function.  There was some concern that maybe amiodarone was causing this issue.  His prior atrial flutter atypical was spontaneous converted.  On amiodarone 200 mg a day as well as Eliquis.  Stamina continues to be an issue but has not progressed significantly.  Shortness of breath has been fairly stable.  Echocardiogram back in 2023 showed normal pulm function.  Past Medical History:  Diagnosis Date   Coronary artery disease    PTCA of the codominant LAD, 1992, CABG July 94   Diabetes mellitus without complication    Diverticulosis    Erectile dysfunction    HTN (hypertension)    Hyperlipidemia    Myocardial infarction    Obesity    OSA (obstructive sleep apnea)    Tinnitus     Past Surgical History:  Procedure Laterality Date   CORONARY ANGIOPLASTY     CORONARY ARTERY BYPASS GRAFT     VASECTOMY      Current Medications: Current Meds  Medication Sig   amiodarone (PACERONE) 200 MG tablet Take 0.5 tablets (100 mg total) by mouth daily.   apixaban (ELIQUIS) 5 MG TABS tablet Take 5 mg by mouth 2 (two) times daily.   atorvastatin (LIPITOR) 80 MG tablet Take 80 mg by mouth daily.    diltiazem (CARDIZEM CD) 180 MG 24 hr capsule Take 1 capsule (180 mg  total) by mouth daily.   Dulaglutide (TRULICITY) 3 MG/0.5ML SOPN Inject 3 mg into the skin once a week.   empagliflozin (JARDIANCE) 25 MG TABS tablet Take 25 mg by mouth daily.   lisinopril-hydrochlorothiazide (PRINZIDE,ZESTORETIC) 20-12.5 MG tablet Take 1 tablet by mouth daily.   metFORMIN (GLUCOPHAGE) 1000 MG tablet Take 500 mg by mouth 2 (two) times daily with a meal. Pt takes 2 tablets 500 mg in the morning and 2 tablet 500 mg at night.   Multiple Vitamin (MULTIVITAMIN WITH MINERALS) TABS tablet Take 1 tablet by mouth daily.   nitroGLYCERIN (NITROSTAT) 0.4 MG SL tablet Place 1 tablet (0.4 mg total) under the tongue every 5 (five) minutes as needed for chest pain.   oxyCODONE-acetaminophen (PERCOCET/ROXICET) 5-325 MG tablet Take 1 tablet by mouth at bedtime as needed (pain).      Allergies:   Patient has no known allergies.   Social History   Socioeconomic History   Marital status: Married    Spouse name: Not on file   Number of children: Not on file   Years of education: Not on file   Highest education level: Not on file  Occupational History   Not on file  Tobacco Use   Smoking status: Former   Smokeless tobacco: Never  Vaping Use   Vaping Use: Never used  Substance and  Sexual Activity   Alcohol use: No    Alcohol/week: 0.0 standard drinks of alcohol   Drug use: No   Sexual activity: Not on file  Other Topics Concern   Not on file  Social History Narrative   Not on file   Social Determinants of Health   Financial Resource Strain: Not on file  Food Insecurity: Not on file  Transportation Needs: Not on file  Physical Activity: Not on file  Stress: Not on file  Social Connections: Not on file     Family History: The patient's family history includes Healthy in his mother, sister, and sister; Heart attack in his father; Heart disease in his father; Other in his brother.  ROS:   Please see the history of present illness.     All other systems reviewed and are  negative.  EKGs/Labs/Other Studies Reviewed:    The following studies were reviewed today: Cardiac Studies & Procedures     STRESS TESTS  MYOCARDIAL PERFUSION IMAGING 05/27/2020  Narrative  Nuclear stress EF: 63%. No wall motion abnormalities  There was no ST segment deviation noted during stress.  Defect 1: There is a small defect of mild severity present in the apex location.  This is a low risk study. No ischemia identified.  Donato SchultzMark Leotis Isham, MD   ECHOCARDIOGRAM  ECHOCARDIOGRAM COMPLETE 08/19/2021  Narrative ECHOCARDIOGRAM REPORT    Patient Name:   Joshua BoydenJames E Terrell Date of Exam: 08/19/2021 Medical Rec #:  [id number]      Height:       72.0 in Accession #:    [id number]     Weight:       260.2 lb Date of Birth:  December 03, 1945      BSA:          2.383 m Patient Age:    75 years       BP:           104/70 mmHg Patient Gender: M              HR:           65 bpm. Exam Location:  [address]  Procedure: 2D Echo, 3D Echo, Cardiac Doppler and Color Doppler  Indications:    R06.00 Dyspnea  History:        Patient has prior history of Echocardiogram examinations, most recent 12/24/2015. CHF, CAD and Previous Myocardial Infarction, Prior CABG, Arrythmias:Atrial Flutter, Signs/Symptoms:Dyspnea and Fatigue; Risk Factors:Sleep Apnea, Former Smoker, Dyslipidemia, Diabetes, Hypertension and Family History of Coronary Artery Disease. Obesity.  Sonographer:    Farrel ConnersBethany Mcmahill RDCS Referring Phys: Barry DienesHENRY W [facility], Brownsville CampusMITH  IMPRESSIONS   1. Left ventricular ejection fraction, by estimation, is 55 to 60%. The left ventricle has normal function. The left ventricle has no regional wall motion abnormalities. Left ventricular diastolic parameters were normal. 2. Right ventricular systolic function is normal. The right ventricular size is normal. There is normal pulmonary artery systolic pressure. The estimated right ventricular systolic pressure is 31.7 mmHg. 3. Left atrial size was mildly  dilated. 4. Right atrial size was mildly dilated. 5. The mitral valve is normal in structure. Trivial mitral valve regurgitation. No evidence of mitral stenosis. 6. The aortic valve is tricuspid. There is moderate calcification of the aortic valve. Aortic valve regurgitation is not visualized. Aortic valve sclerosis/calcification is present, without any evidence of aortic stenosis. 7. The inferior vena cava is normal in size with greater than 50% respiratory variability, suggesting right atrial pressure of 3 mmHg. 8. Aortic dilatation  noted. There is mild dilatation of the ascending aorta, measuring 41 mm.  FINDINGS Left Ventricle: Left ventricular ejection fraction, by estimation, is 55 to 60%. The left ventricle has normal function. The left ventricle has no regional wall motion abnormalities. The left ventricular internal cavity size was normal in size. There is no left ventricular hypertrophy. Left ventricular diastolic parameters were normal.  Right Ventricle: The right ventricular size is normal. No increase in right ventricular wall thickness. Right ventricular systolic function is normal. There is normal pulmonary artery systolic pressure. The tricuspid regurgitant velocity is 2.68 m/s, and with an assumed right atrial pressure of 3 mmHg, the estimated right ventricular systolic pressure is 31.7 mmHg.  Left Atrium: Left atrial size was mildly dilated.  Right Atrium: Right atrial size was mildly dilated.  Pericardium: There is no evidence of pericardial effusion.  Mitral Valve: The mitral valve is normal in structure. Trivial mitral valve regurgitation. No evidence of mitral valve stenosis.  Tricuspid Valve: The tricuspid valve is normal in structure. Tricuspid valve regurgitation is mild.  Aortic Valve: The aortic valve is tricuspid. There is moderate calcification of the aortic valve. Aortic valve regurgitation is not visualized. Aortic valve sclerosis/calcification is present,  without any evidence of aortic stenosis. Aortic valve mean gradient measures 7.0 mmHg. Aortic valve peak gradient measures 11.7 mmHg. Aortic valve area, by VTI measures 2.00 cm.  Pulmonic Valve: The pulmonic valve was normal in structure. Pulmonic valve regurgitation is trivial.  Aorta: Aortic dilatation noted. There is mild dilatation of the ascending aorta, measuring 41 mm.  Venous: The inferior vena cava is normal in size with greater than 50% respiratory variability, suggesting right atrial pressure of 3 mmHg.  IAS/Shunts: No atrial level shunt detected by color flow Doppler.   LEFT VENTRICLE PLAX 2D LVIDd:         4.50 cm   Diastology LVIDs:         2.50 cm   LV e' medial:    9.57 cm/s LV PW:         1.00 cm   LV E/e' medial:  10.6 LV IVS:        1.10 cm   LV e' lateral:   14.00 cm/s LVOT diam:     2.00 cm   LV E/e' lateral: 7.2 LV SV:         79 LV SV Index:   33 LVOT Area:     3.14 cm  3D Volume EF: 3D EF:        58 % LV EDV:       137 ml LV ESV:       58 ml LV SV:        79 ml  RIGHT VENTRICLE RV S prime:     11.60 cm/s TAPSE (M-mode): 2.2 cm  LEFT ATRIUM             Index        RIGHT ATRIUM           Index LA diam:        4.50 cm 1.89 cm/m   RA Area:     26.90 cm LA Vol (A2C):   77.7 ml 32.61 ml/m  RA Volume:   88.30 ml  37.06 ml/m LA Vol (A4C):   92.3 ml 38.74 ml/m LA Biplane Vol: 90.1 ml 37.81 ml/m AORTIC VALVE AV Area (Vmax):    1.97 cm AV Area (Vmean):   1.90 cm AV Area (VTI):  2.00 cm AV Vmax:           171.00 cm/s AV Vmean:          120.000 cm/s AV VTI:            0.394 m AV Peak Grad:      11.7 mmHg AV Mean Grad:      7.0 mmHg LVOT Vmax:         107.00 cm/s LVOT Vmean:        72.450 cm/s LVOT VTI:          0.251 m LVOT/AV VTI ratio: 0.64  AORTA Ao Root diam: 3.80 cm Ao Asc diam:  4.00 cm  MITRAL VALVE                TRICUSPID VALVE MV Area (PHT): cm          TR Peak grad:   28.7 mmHg MV Decel Time: 206 msec     TR Vmax:         268.00 cm/s MV E velocity: 101.00 cm/s MV A velocity: 67.10 cm/s   SHUNTS MV E/A ratio:  1.51         Systemic VTI:  0.25 m Systemic Diam: 2.00 cm  Dalton McleanMD Electronically signed by Wilfred Lacy Signature Date/Time: 08/19/2021/4:14:17 PM    Final              EKG:  The ekg ordered today demonstrates 09/09/2022-sinus rhythm 61 first-degree AV block PR interval 288 ms  Recent Labs: No results found for requested labs within last 365 days.  Recent Lipid Panel No results found for: "CHOL", "TRIG", "HDL", "CHOLHDL", "VLDL", "LDLCALC", "LDLDIRECT"   Risk Assessment/Calculations:              Physical Exam:    VS:  BP (!) 142/80   Pulse 61   Ht 6' (1.829 m)   Wt 259 lb 3.2 oz (117.6 kg)   SpO2 98%   BMI 35.15 kg/m     Wt Readings from Last 3 Encounters:  09/09/22 259 lb 3.2 oz (117.6 kg)  08/05/21 260 lb 3.2 oz (118 kg)  02/07/21 265 lb (120.2 kg)     GEN:  Well nourished, well developed in no acute distress HEENT: Normal NECK: No JVD; No carotid bruits LYMPHATICS: No lymphadenopathy CARDIAC: RRR, no murmurs, rubs, gallops RESPIRATORY:  Clear to auscultation without rales, wheezing or rhonchi  ABDOMEN: Soft, non-tender, non-distended MUSCULOSKELETAL:  No edema; No deformity  SKIN: Warm and dry NEUROLOGIC:  Alert and oriented x 3 PSYCHIATRIC:  Normal affect   ASSESSMENT:    1. Atrial flutter, unspecified type   2. Medication management   3. Chronic diastolic heart failure   4. Essential hypertension, benign   5. On amiodarone therapy    PLAN:    In order of problems listed above:  Coronary artery disease - Post bypass - Secondary prevention.  Chronic diastolic heart failure - On Jardiance as well as ACE inhibitor.  EF normal.  Continue with goal-directed medical therapy.  On amiodarone therapy 100 mg. - Continue to monitor liver functions thyroid.  Prior TSH 3.47.  Obstructive sleep apnea - On CPAP  Hypertension On both Zestoretic  as well as diltiazem.  Chronic anticoagulation - Eliquis.  Continue to monitor CBC as well as creatinine.  Prior creatinine 1.2 hemoglobin 12.7 LDL 39  First-degree AV block - 288 ms.  Carefully monitoring in the setting of amiodarone.  Continue with 100 mg amiodarone.  Medication Adjustments/Labs and Tests Ordered: Current medicines are reviewed at length with the patient today.  Concerns regarding medicines are outlined above.  Orders Placed This Encounter  Procedures   CBC   Comprehensive metabolic panel   Lipid panel   TSH   T4, free   EKG 12-Lead   No orders of the defined types were placed in this encounter.   Patient Instructions  Medication Instructions:  The current medical regimen is effective;  continue present plan and medications.  *If you need a refill on your cardiac medications before your next appointment, please call your pharmacy*   Lab Work: Please have blood work today (CBC, CMP, TSH, Free T4 and Lipid)  If you have labs (blood work) drawn today and your tests are completely normal, you will receive your results only by: MyChart Message (if you have MyChart) OR A paper copy in the mail If you have any lab test that is abnormal or we need to change your treatment, we will call you to review the results.  Follow-Up: At [facility], you and your health needs are our priority.  As part of our continuing mission to provide you with exceptional heart care, we have created designated Provider Care Teams.  These Care Teams include your primary Cardiologist (physician) and Advanced Practice Providers (APPs -  Physician Assistants and Nurse Practitioners) who all work together to provide you with the care you need, when you need it.  We recommend signing up for the patient portal called "MyChart".  Sign up information is provided on this After Visit Summary.  MyChart is used to connect with patients for Virtual Visits (Telemedicine).  Patients  are able to view lab/test results, encounter notes, upcoming appointments, etc.  Non-urgent messages can be sent to your provider as well.   To learn more about what you can do with MyChart, go to [email or web address].    Your next appointment:   6 month(s)  Provider:   Jari Favre, PA-C, Robin Searing, NP, Jacolyn Reedy, PA-C, Eligha Bridegroom, NP, or Tereso Newcomer, PA-C     Then, Donato Schultz, MD will plan to see you again in 1 year(s).       Signed, Donato Schultz, MD  09/09/2022 10:38 AM    [facility]

## 2022-09-09 NOTE — Patient Instructions (Signed)
Medication Instructions:  The current medical regimen is effective;  continue present plan and medications.  *If you need a refill on your cardiac medications before your next appointment, please call your pharmacy*   Lab Work: Please have blood work today (CBC, CMP, TSH, Free T4 and Lipid)  If you have labs (blood work) drawn today and your tests are completely normal, you will receive your results only by: MyChart Message (if you have MyChart) OR A paper copy in the mail If you have any lab test that is abnormal or we need to change your treatment, we will call you to review the results.  Follow-Up: At [facility], you and your health needs are our priority.  As part of our continuing mission to provide you with exceptional heart care, we have created designated Provider Care Teams.  These Care Teams include your primary Cardiologist (physician) and Advanced Practice Providers (APPs -  Physician Assistants and Nurse Practitioners) who all work together to provide you with the care you need, when you need it.  We recommend signing up for the patient portal called "MyChart".  Sign up information is provided on this After Visit Summary.  MyChart is used to connect with patients for Virtual Visits (Telemedicine).  Patients are able to view lab/test results, encounter notes, upcoming appointments, etc.  Non-urgent messages can be sent to your provider as well.   To learn more about what you can do with MyChart, go to [email or web address].    Your next appointment:   6 month(s)  Provider:   Jari Favre, PA-C, Robin Searing, NP, Jacolyn Reedy, PA-C, Eligha Bridegroom, NP, or Tereso Newcomer, PA-C     Then, Donato Schultz, MD will plan to see you again in 1 year(s).

## 2022-09-10 ENCOUNTER — Other Ambulatory Visit: Payer: Self-pay | Admitting: *Deleted

## 2022-09-10 LAB — COMPREHENSIVE METABOLIC PANEL
ALT: 18 IU/L (ref 0–44)
AST: 19 IU/L (ref 0–40)
Albumin/Globulin Ratio: 1.7 (ref 1.2–2.2)
Albumin: 4.5 g/dL (ref 3.8–4.8)
Alkaline Phosphatase: 81 IU/L (ref 44–121)
BUN/Creatinine Ratio: 17 (ref 10–24)
BUN: 21 mg/dL (ref 8–27)
Bilirubin Total: 0.2 mg/dL (ref 0.0–1.2)
CO2: 23 mmol/L (ref 20–29)
Calcium: 10.2 mg/dL (ref 8.6–10.2)
Chloride: 104 mmol/L (ref 96–106)
Creatinine, Ser: 1.27 mg/dL (ref 0.76–1.27)
Globulin, Total: 2.7 g/dL (ref 1.5–4.5)
Glucose: 113 mg/dL — ABNORMAL HIGH (ref 70–99)
Potassium: 4.7 mmol/L (ref 3.5–5.2)
Sodium: 143 mmol/L (ref 134–144)
Total Protein: 7.2 g/dL (ref 6.0–8.5)
eGFR: 58 mL/min/{1.73_m2} — ABNORMAL LOW (ref >59)

## 2022-09-10 LAB — CBC
Hematocrit: 39.3 % (ref 37.5–51.0)
Hemoglobin: 12.8 g/dL — ABNORMAL LOW (ref 13.0–17.7)
MCH: 29 pg (ref 26.6–33.0)
MCHC: 32.6 g/dL (ref 31.5–35.7)
MCV: 89 fL (ref 79–97)
Platelets: 269 10*3/uL (ref 150–450)
RBC: 4.42 x10E6/uL (ref 4.14–5.80)
RDW: 14.2 % (ref 11.6–15.4)
WBC: 5.8 10*3/uL (ref 3.4–10.8)

## 2022-09-10 LAB — LIPID PANEL
Chol/HDL Ratio: 1.9 ratio (ref 0.0–5.0)
Cholesterol, Total: 114 mg/dL (ref 100–199)
HDL: 59 mg/dL (ref >39)
LDL Chol Calc (NIH): 40 mg/dL (ref 0–99)
Triglycerides: 73 mg/dL (ref 0–149)
VLDL Cholesterol Cal: 15 mg/dL (ref 5–40)

## 2022-09-10 LAB — T4, FREE: Free T4: 1.3 ng/dL (ref 0.82–1.77)

## 2022-09-10 LAB — TSH: TSH: 1.24 u[IU]/mL (ref 0.450–4.500)

## 2022-09-16 ENCOUNTER — Telehealth: Payer: Self-pay | Admitting: Cardiology

## 2022-09-16 NOTE — Telephone Encounter (Signed)
Patient's wife would like to confirm clearance request has been received. She states it was faxed to the office from [facility] yesterday. Patient is scheduled for shoulder surgery on 5/02. Please advise.

## 2022-09-17 NOTE — Telephone Encounter (Signed)
I s/w the pt and his wife about clearance. I stated that we have not yet received a request yet. I stated to the pt and his wife that I will call surgery scheduler for Dr. Jones Broom at [facility]. I s/w Darel Hong, surgery scheduler and she will fax clearance request to me at [phone].

## 2022-09-18 ENCOUNTER — Other Ambulatory Visit: Payer: Self-pay | Admitting: Orthopedic Surgery

## 2022-09-18 NOTE — Progress Notes (Signed)
Sent message, via epic in basket, requesting orders in epic from surgeon.  

## 2022-09-21 ENCOUNTER — Other Ambulatory Visit: Payer: Self-pay

## 2022-09-21 MED ORDER — AMIODARONE HCL 200 MG PO TABS: 100.0000 mg | ORAL_TABLET | Freq: Every day | ORAL | 3 refills | Status: DC

## 2022-09-23 ENCOUNTER — Telehealth: Payer: Self-pay | Admitting: Cardiology

## 2022-09-23 NOTE — Telephone Encounter (Signed)
SEE PHONE NOTES FROM 09/16/22 IN REGARD TO CLEARANCE REQUEST.   Our office received the request today 09/23/22 @ 5:01 pm. Request has been sent to pre op for review and recommendations.

## 2022-09-23 NOTE — Progress Notes (Addendum)
Anesthesia Review:  PCP: DR Cherene Julian  Cardiologist : Dr Donato Schultz - clearance 09/23/22- telephone encounter  LOV 09/09/2022.  Chest x-ray : EKG : 09/09/22  Echo : 08/19/21  Stress test: 05/27/20  Cardiac Cath :  Activity level: can do a flight of stairs without difficutly  Sleep Study/ CPAP : has cpap  Fasting Blood Sugar :      / Checks Blood Sugar -- times a day:   Blood Thinner/ Instructions /Last Dose: ASA / Instructions/ Last Dose :    Eliquis - stop 7 days prior per pt 's wife Last dose on 09/25/22     DM- type 2- checks glucose once per week  Hgba1c- 09/25/22- 6.8  Metformin- none day of surgery  Basaglar- Insulin - Take 1/2 dose  pm day before surgery  Trulicity- Sunday- Last dose on 09/20/22  Jardiance - Hold for 72 hours prior to surgery - Last dose 09/27/22.     09/09/22- cbc and cmp - epic    PT is extremely HOH.  Wife needs to be with him in Short Stay.

## 2022-09-23 NOTE — Telephone Encounter (Signed)
   Pre-operative Risk Assessment    Patient Name: Joshua Terrell  DOB: Jul 04, 1945 MRN: [id number]      Request for Surgical Clearance    Procedure:   Right shoulder arthroscopy   Date of Surgery:  Clearance 10/01/22                                 Surgeon:  Dr. Moise Boring Surgeon's Group or Practice Name:  [facility]  Phone number:  [phone] Fax number:  [phone]   Type of Clearance Requested:   - Medical  - Pharmacy:  Hold Apixaban (Eliquis) TDB   Type of Anesthesia:  Not Indicated   Additional requests/questions:      Melissa Noon   09/23/2022, 5:01 PM

## 2022-09-24 NOTE — Telephone Encounter (Signed)
Patient with diagnosis of aflutter on Eliquis for anticoagulation.    Procedure: right shoulder arthroscopy Date of procedure: 10/01/22  CHA2DS2-VASc Score = 6  This indicates a 9.7% annual risk of stroke. The patient's score is based upon: CHF History: 1 HTN History: 1 Diabetes History: 1 Stroke History: 0 Vascular Disease History: 1 Age Score: 2 Gender Score: 0   CrCl 71mL/min using adj body weight Platelet count 269K  Per office protocol, patient can hold Eliquis for 3 days prior to procedure.    **This guidance is not considered finalized until pre-operative APP has relayed final recommendations.**

## 2022-09-24 NOTE — Patient Instructions (Signed)
SURGICAL WAITING ROOM VISITATION  Patients having surgery or a procedure may have no more than 2 support people in the waiting area - these visitors may rotate.    Children under the age of 36 must have an adult with them who is not the patient.  Due to an increase in RSV and influenza rates and associated hospitalizations, children ages 42 and under may not visit patients in [facility] hospitals.  If the patient needs to stay at the hospital during part of their recovery, the visitor guidelines for inpatient rooms apply. Pre-op nurse will coordinate an appropriate time for 1 support person to accompany patient in pre-op.  This support person may not rotate.    Please refer to the [facility] website for the visitor guidelines for Inpatients (after your surgery is over and you are in a regular room).       Your procedure is scheduled on: 10/01/2022    Report to [facility] Main Entrance    Report to admitting at   0930 AM   Call this number if you have problems the morning of surgery [phone]   Do not eat food :After Midnight.   After Midnight you may have the following liquids until _ 0830_____ AM  DAY OF SURGERY  Water Non-Citrus Juices (without pulp, NO RED-Apple, White grape, White cranberry) Black Coffee (NO MILK/CREAM OR CREAMERS, sugar ok)  Clear Tea (NO MILK/CREAM OR CREAMERS, sugar ok) regular and decaf                             Plain Jell-O (NO RED)                                           Fruit ices (not with fruit pulp, NO RED)                                     Popsicles (NO RED)                                                               Sports drinks like Gatorade (NO RED)                     The day of surgery:  Drink ONE (1) Pre-Surgery Clear Ensure or G2 at  0830 AM ( have completed by )  the morning of surgery. Drink in one sitting. Do not sip.  This drink was given to you during your hospital  pre-op appointment visit. Nothing else to  drink after completing the  Pre-Surgery Clear Ensure or G2.          If you have questions, please contact your surgeon's office.       Oral Hygiene is also important to reduce your risk of infection.                                    Remember - BRUSH YOUR TEETH THE MORNING OF  SURGERY WITH YOUR REGULAR TOOTHPASTE  DENTURES WILL BE REMOVED PRIOR TO SURGERY PLEASE DO NOT APPLY "Poly grip" OR ADHESIVES!!!   Do NOT smoke after Midnight   Take these medicines the morning of surgery with A SIP OF WATER:  pacerone, cardizem   DO NOT TAKE ANY ORAL DIABETIC MEDICATIONS DAY OF YOUR SURGERY  Bring CPAP mask and tubing day of surgery.                              You may not have any metal on your body including hair pins, jewelry, and body piercing             Do not wear make-up, lotions, powders, perfumes/cologne, or deodorant  Do not wear nail polish including gel and S&S, artificial/acrylic nails, or any other type of covering on natural nails including finger and toenails. If you have artificial nails, gel coating, etc. that needs to be removed by a nail salon please have this removed prior to surgery or surgery may need to be canceled/ delayed if the surgeon/ anesthesia feels like they are unable to be safely monitored.   Do not shave  48 hours prior to surgery.               Men may shave face and neck.   Do not bring valuables to the hospital. [facility] IS NOT             RESPONSIBLE   FOR VALUABLES.   Contacts, glasses, dentures or bridgework may not be worn into surgery.   Bring small overnight bag day of surgery.   DO NOT BRING YOUR HOME MEDICATIONS TO THE HOSPITAL. PHARMACY WILL DISPENSE MEDICATIONS LISTED ON YOUR MEDICATION LIST TO YOU DURING YOUR ADMISSION IN THE HOSPITAL!    Patients discharged on the day of surgery will not be allowed to drive home.  Someone NEEDS to stay with you for the first 24 hours after anesthesia.   Special Instructions: Bring a copy of your  healthcare power of attorney and living will documents the day of surgery if you haven't scanned them before.              Please read over the following fact sheets you were given: IF YOU HAVE QUESTIONS ABOUT YOUR PRE-OP INSTRUCTIONS PLEASE CALL [phone]   If you received a COVID test during your pre-op visit  it is requested that you wear a mask when out in public, stay away from anyone that may not be feeling well and notify your surgeon if you develop symptoms. If you test positive for Covid or have been in contact with anyone that has tested positive in the last 10 days please notify you surgeon.    [facility] - Preparing for Surgery Before surgery, you can play an important role.  Because skin is not sterile, your skin needs to be as free of germs as possible.  You can reduce the number of germs on your skin by washing with CHG (chlorahexidine gluconate) soap before surgery.  CHG is an antiseptic cleaner which kills germs and bonds with the skin to continue killing germs even after washing. Please DO NOT use if you have an allergy to CHG or antibacterial soaps.  If your skin becomes reddened/irritated stop using the CHG and inform your nurse when you arrive at Short Stay. Do not shave (including legs and underarms) for at least 48 hours prior to the  first CHG shower.  You may shave your face/neck. Please follow these instructions carefully:  1.  Shower with CHG Soap the night before surgery and the  morning of Surgery.  2.  If you choose to wash your hair, wash your hair first as usual with your  normal  shampoo.  3.  After you shampoo, rinse your hair and body thoroughly to remove the  shampoo.                           4.  Use CHG as you would any other liquid soap.  You can apply chg directly  to the skin and wash                       Gently with a scrungie or clean washcloth.  5.  Apply the CHG Soap to your body ONLY FROM THE NECK DOWN.   Do not use on face/ open                            Wound or open sores. Avoid contact with eyes, ears mouth and genitals (private parts).                       Wash face,  Genitals (private parts) with your normal soap.             6.  Wash thoroughly, paying special attention to the area where your surgery  will be performed.  7.  Thoroughly rinse your body with warm water from the neck down.  8.  DO NOT shower/wash with your normal soap after using and rinsing off  the CHG Soap.                9.  Pat yourself dry with a clean towel.            10.  Wear clean pajamas.            11.  Place clean sheets on your bed the night of your first shower and do not  sleep with pets. Day of Surgery : Do not apply any lotions/deodorants the morning of surgery.  Please wear clean clothes to the hospital/surgery center.  FAILURE TO FOLLOW THESE INSTRUCTIONS MAY RESULT IN THE CANCELLATION OF YOUR SURGERY PATIENT SIGNATURE_________________________________  NURSE SIGNATURE__________________________________  ________________________________________________________________________

## 2022-09-25 ENCOUNTER — Encounter (HOSPITAL_COMMUNITY): Payer: Self-pay

## 2022-09-25 ENCOUNTER — Other Ambulatory Visit: Payer: Self-pay

## 2022-09-25 ENCOUNTER — Encounter (HOSPITAL_COMMUNITY)
Admission: RE | Admit: 2022-09-25 | Discharge: 2022-09-25 | Disposition: A | Discharge status: Home / Self Care | Payer: PPO | Source: Ambulatory Visit | Attending: Orthopedic Surgery | Admitting: Orthopedic Surgery

## 2022-09-25 VITALS — BP 128/66 | HR 77 | Temp 98.2°F | Resp 16 | Ht 72.0 in | Wt 252.0 lb

## 2022-09-25 DIAGNOSIS — E119 Type 2 diabetes mellitus without complications: Secondary | ICD-10-CM | POA: Insufficient documentation

## 2022-09-25 DIAGNOSIS — I2510 Atherosclerotic heart disease of native coronary artery without angina pectoris: Secondary | ICD-10-CM | POA: Diagnosis not present

## 2022-09-25 DIAGNOSIS — G4733 Obstructive sleep apnea (adult) (pediatric): Secondary | ICD-10-CM | POA: Diagnosis not present

## 2022-09-25 DIAGNOSIS — Z01812 Encounter for preprocedural laboratory examination: Secondary | ICD-10-CM | POA: Diagnosis present

## 2022-09-25 DIAGNOSIS — M75101 Unspecified rotator cuff tear or rupture of right shoulder, not specified as traumatic: Secondary | ICD-10-CM | POA: Diagnosis not present

## 2022-09-25 DIAGNOSIS — Z7901 Long term (current) use of anticoagulants: Secondary | ICD-10-CM | POA: Diagnosis not present

## 2022-09-25 DIAGNOSIS — I10 Essential (primary) hypertension: Secondary | ICD-10-CM | POA: Insufficient documentation

## 2022-09-25 DIAGNOSIS — Z87891 Personal history of nicotine dependence: Secondary | ICD-10-CM | POA: Insufficient documentation

## 2022-09-25 DIAGNOSIS — I4891 Unspecified atrial fibrillation: Secondary | ICD-10-CM | POA: Diagnosis not present

## 2022-09-25 DIAGNOSIS — Z951 Presence of aortocoronary bypass graft: Secondary | ICD-10-CM | POA: Insufficient documentation

## 2022-09-25 DIAGNOSIS — Z01818 Encounter for other preprocedural examination: Secondary | ICD-10-CM

## 2022-09-25 HISTORY — DX: Unspecified osteoarthritis, unspecified site: M19.90

## 2022-09-25 HISTORY — DX: Unspecified atrial fibrillation: I48.91

## 2022-09-25 LAB — GLUCOSE, CAPILLARY: Glucose-Capillary: 173 mg/dL — ABNORMAL HIGH (ref 70–99)

## 2022-09-25 LAB — CBC
HCT: 41.5 % (ref 39.0–52.0)
Hemoglobin: 13.2 g/dL (ref 13.0–17.0)
MCH: 29.5 pg (ref 26.0–34.0)
MCHC: 31.8 g/dL (ref 30.0–36.0)
MCV: 92.6 fL (ref 80.0–100.0)
Platelets: 252 10*3/uL (ref 150–400)
RBC: 4.48 MIL/uL (ref 4.22–5.81)
RDW: 15.2 % (ref 11.5–15.5)
WBC: 7 10*3/uL (ref 4.0–10.5)
nRBC: 0 % (ref 0.0–0.2)

## 2022-09-25 LAB — BASIC METABOLIC PANEL
Anion gap: 13 (ref 5–15)
BUN: 27 mg/dL — ABNORMAL HIGH (ref 8–23)
CO2: 19 mmol/L — ABNORMAL LOW (ref 22–32)
Calcium: 9.6 mg/dL (ref 8.9–10.3)
Chloride: 108 mmol/L (ref 98–111)
Creatinine, Ser: 1.24 mg/dL (ref 0.61–1.24)
GFR, Estimated: 60 mL/min — ABNORMAL LOW (ref >60)
Glucose, Bld: 196 mg/dL — ABNORMAL HIGH (ref 70–99)
Potassium: 4.4 mmol/L (ref 3.5–5.1)
Sodium: 140 mmol/L (ref 135–145)

## 2022-09-25 LAB — HEMOGLOBIN A1C
Hgb A1c MFr Bld: 6.8 % — ABNORMAL HIGH (ref 4.8–5.6)
Mean Plasma Glucose: 148.46 mg/dL

## 2022-09-25 NOTE — Telephone Encounter (Signed)
   Patient Name: Joshua Terrell  DOB: [birth year] MRN: [id number]  Primary Cardiologist: Donato Schultz, MD  Chart reviewed as part of pre-operative protocol coverage. Given past medical history and time since last visit, based on ACC/AHA guidelines, Joshua Terrell is at acceptable risk for the planned procedure without further cardiovascular testing.   Per Dr. Anne Fu, primary cardiologist, "Okay to proceed with surgery.  Okay to hold Eliquis for 3 days prior to surgery for potential spinal anesthetic.  Resume post op."  I will route this recommendation to the requesting party via Epic fax function and remove from pre-op pool.  Please call with questions.  Joylene Grapes, NP 09/25/2022, 7:38 AM

## 2022-09-28 ENCOUNTER — Encounter: Payer: Self-pay | Admitting: Cardiology

## 2022-09-28 NOTE — Anesthesia Preprocedure Evaluation (Signed)
Anesthesia Evaluation  Patient identified by MRN, date of birth, ID band Patient awake    Reviewed: Allergy & Precautions, NPO status , Patient's Chart, lab work & pertinent test results  Airway Mallampati: II  TM Distance: >3 FB Neck ROM: Full    Dental  (+) Edentulous Lower, Edentulous Upper   Pulmonary sleep apnea and Continuous Positive Airway Pressure Ventilation , former smoker   Pulmonary exam normal        Cardiovascular hypertension, Pt. on medications + CAD and + CABG  Normal cardiovascular exam+ dysrhythmias Atrial Fibrillation      Neuro/Psych negative neurological ROS  negative psych ROS   GI/Hepatic negative GI ROS, Neg liver ROS,,,  Endo/Other  diabetes, Oral Hypoglycemic Agents, Insulin Dependent    Renal/GU negative Renal ROS     Musculoskeletal negative musculoskeletal ROS (+)    Abdominal  (+) + obese  Peds  Hematology negative hematology ROS (+)   Anesthesia Other Findings   Reproductive/Obstetrics                             Anesthesia Physical Anesthesia Plan  ASA: 3  Anesthesia Plan: General and Regional   Post-op Pain Management: Regional block*   Induction: Intravenous  PONV Risk Score and Plan: 2 and Ondansetron, Dexamethasone and Treatment may vary due to age or medical condition  Airway Management Planned: Oral ETT  Additional Equipment:   Intra-op Plan:   Post-operative Plan: Extubation in OR  Informed Consent: I have reviewed the patients History and Physical, chart, labs and discussed the procedure including the risks, benefits and alternatives for the proposed anesthesia with the patient or authorized representative who has indicated his/her understanding and acceptance.     Dental advisory given  Plan Discussed with: CRNA  Anesthesia Plan Comments: (PAT note 09/25/2022)       Anesthesia Quick Evaluation

## 2022-09-28 NOTE — Progress Notes (Signed)
Anesthesia Chart Review   Case: [id number] Date/Time: 10/01/22 1110   Procedure: SHOULDER ARTHROSCOPY WITH ROTATOR CUFF REPAIR VERSUS DEBRIDEMNENT,  SUBACROMIAL DECOMPRESSION, BICEPS TENOTOMY (Right)   Anesthesia type: Choice   Pre-op diagnosis: RIGHT SHOULDER ROTATOR CUFF TEAR   Location: [facility] ROOM 07 / [facility]   Surgeons: Jones Broom, MD       DISCUSSION:77 year old former smoker with history of HTN, OSA on CPAP, A-fib, diabetes mellitus, CAD status post CABG 1994, right shoulder rotator cuff tear scheduled for above procedure 10/01/2022 with Dr. Jones Broom.  Per cardiology preoperative evaluation 09/25/2022, "Chart reviewed as part of pre-operative protocol coverage. Given past medical history and time since last visit, based on ACC/AHA guidelines, Joshua Terrell is at acceptable risk for the planned procedure without further cardiovascular testing.    Per Dr. Anne Fu, primary cardiologist, "Okay to proceed with surgery.  Okay to hold Eliquis for 3 days prior to surgery for potential spinal anesthetic.  Resume post op."    Anticipate pt can proceed with planned procedure barring acute status change.   VS: BP 128/66   Pulse 77   Temp 36.8 C (Oral)   Resp 16   Ht 6' (1.829 m)   Wt 114.3 kg   SpO2 96%   BMI 34.18 kg/m   PROVIDERS: Kirby Funk, MD is PCP  Cardiologist : Dr Donato Schultz  LABS: Labs reviewed: Acceptable for surgery. (all labs ordered are listed, but only abnormal results are displayed)  Labs Reviewed  HEMOGLOBIN A1C - Abnormal; Notable for the following components:      Result Value   Hgb A1c MFr Bld 6.8 (*)    All other components within normal limits  BASIC METABOLIC PANEL - Abnormal; Notable for the following components:   CO2 19 (*)    Glucose, Bld 196 (*)    BUN 27 (*)    GFR, Estimated 60 (*)    All other components within normal limits  GLUCOSE, CAPILLARY - Abnormal; Notable for the following components:   Glucose-Capillary 173 (*)    All  other components within normal limits  CBC     IMAGES:   EKG:   CV: Echo 08/19/2021  1. Left ventricular ejection fraction, by estimation, is 55 to 60%. The  left ventricle has normal function. The left ventricle has no regional  wall motion abnormalities. Left ventricular diastolic parameters were  normal.   2. Right ventricular systolic function is normal. The right ventricular  size is normal. There is normal pulmonary artery systolic pressure. The  estimated right ventricular systolic pressure is 31.7 mmHg.   3. Left atrial size was mildly dilated.   4. Right atrial size was mildly dilated.   5. The mitral valve is normal in structure. Trivial mitral valve  regurgitation. No evidence of mitral stenosis.   6. The aortic valve is tricuspid. There is moderate calcification of the  aortic valve. Aortic valve regurgitation is not visualized. Aortic valve  sclerosis/calcification is present, without any evidence of aortic  stenosis.   7. The inferior vena cava is normal in size with greater than 50%  respiratory variability, suggesting right atrial pressure of 3 mmHg.   8. Aortic dilatation noted. There is mild dilatation of the ascending  aorta, measuring 41 mm.  Myocardial perfusion 05/27/2020  Nuclear stress EF: 63%. No wall motion abnormalities There was no ST segment deviation noted during stress. Defect 1: There is a small defect of mild severity present in the apex location. This is  a low risk study. No ischemia identified. Past Medical History:  Diagnosis Date   A-fib [facility])    Arthritis    Coronary artery disease    PTCA of the codominant LAD, 1992, CABG July 94   Diabetes mellitus without complication (HCC)    Diverticulosis    Erectile dysfunction    HTN (hypertension)    Hyperlipidemia    Obesity    OSA (obstructive sleep apnea)    cpap   Tinnitus     Past Surgical History:  Procedure Laterality Date   CORONARY ANGIOPLASTY     CORONARY ARTERY BYPASS  GRAFT     hernia repair and undescended testicle surgery      VASECTOMY      MEDICATIONS:  acetaminophen (TYLENOL) 650 MG CR tablet   amiodarone (PACERONE) 200 MG tablet   apixaban (ELIQUIS) 5 MG TABS tablet   atorvastatin (LIPITOR) 80 MG tablet   clotrimazole-betamethasone (LOTRISONE) cream   diltiazem (CARDIZEM CD) 180 MG 24 hr capsule   Dulaglutide (TRULICITY) 3 MG/0.5ML SOPN   empagliflozin (JARDIANCE) 25 MG TABS tablet   Insulin Glargine (BASAGLAR TEMPO PEN [place])   lisinopril-hydrochlorothiazide (PRINZIDE,ZESTORETIC) 20-12.5 MG tablet   metFORMIN (GLUCOPHAGE-XR) 500 MG 24 hr tablet   Multiple Vitamin (MULTIVITAMIN WITH MINERALS) TABS tablet   nitroGLYCERIN (NITROSTAT) 0.4 MG SL tablet   oxyCODONE-acetaminophen (PERCOCET/ROXICET) 5-325 MG tablet   phenylephrine (SUDAFED PE) 10 MG TABS tablet   No current facility-administered medications for this encounter.     Jodell Cipro Ward, PA-C [facility] Pre-Surgical Testing [phone]

## 2022-10-01 ENCOUNTER — Ambulatory Visit (HOSPITAL_COMMUNITY)
Admission: RE | Admit: 2022-10-01 | Discharge: 2022-10-01 | Disposition: A | Discharge status: Home / Self Care | Payer: PPO | Source: Ambulatory Visit | Attending: Orthopedic Surgery | Admitting: Orthopedic Surgery

## 2022-10-01 ENCOUNTER — Other Ambulatory Visit: Payer: Self-pay

## 2022-10-01 ENCOUNTER — Encounter (HOSPITAL_COMMUNITY): Payer: Self-pay | Admitting: Orthopedic Surgery

## 2022-10-01 ENCOUNTER — Ambulatory Visit (HOSPITAL_BASED_OUTPATIENT_CLINIC_OR_DEPARTMENT_OTHER): Payer: PPO | Admitting: Certified Registered"

## 2022-10-01 ENCOUNTER — Ambulatory Visit (HOSPITAL_COMMUNITY): Payer: PPO | Admitting: Physician Assistant

## 2022-10-01 ENCOUNTER — Encounter (HOSPITAL_COMMUNITY)
Admission: RE | Disposition: A | Discharge status: Home / Self Care | Payer: Self-pay | Source: Ambulatory Visit | Attending: Orthopedic Surgery

## 2022-10-01 DIAGNOSIS — I2510 Atherosclerotic heart disease of native coronary artery without angina pectoris: Secondary | ICD-10-CM

## 2022-10-01 DIAGNOSIS — Z87891 Personal history of nicotine dependence: Secondary | ICD-10-CM | POA: Insufficient documentation

## 2022-10-01 DIAGNOSIS — G4733 Obstructive sleep apnea (adult) (pediatric): Secondary | ICD-10-CM | POA: Diagnosis not present

## 2022-10-01 DIAGNOSIS — I10 Essential (primary) hypertension: Secondary | ICD-10-CM | POA: Insufficient documentation

## 2022-10-01 DIAGNOSIS — M75111 Incomplete rotator cuff tear or rupture of right shoulder, not specified as traumatic: Secondary | ICD-10-CM

## 2022-10-01 DIAGNOSIS — Z01818 Encounter for other preprocedural examination: Secondary | ICD-10-CM

## 2022-10-01 DIAGNOSIS — E119 Type 2 diabetes mellitus without complications: Secondary | ICD-10-CM | POA: Diagnosis not present

## 2022-10-01 DIAGNOSIS — M7551 Bursitis of right shoulder: Secondary | ICD-10-CM | POA: Diagnosis not present

## 2022-10-01 DIAGNOSIS — Z794 Long term (current) use of insulin: Secondary | ICD-10-CM | POA: Insufficient documentation

## 2022-10-01 DIAGNOSIS — M7501 Adhesive capsulitis of right shoulder: Secondary | ICD-10-CM | POA: Diagnosis not present

## 2022-10-01 DIAGNOSIS — Z7984 Long term (current) use of oral hypoglycemic drugs: Secondary | ICD-10-CM | POA: Insufficient documentation

## 2022-10-01 DIAGNOSIS — X58XXXA Exposure to other specified factors, initial encounter: Secondary | ICD-10-CM | POA: Diagnosis not present

## 2022-10-01 DIAGNOSIS — Z951 Presence of aortocoronary bypass graft: Secondary | ICD-10-CM | POA: Insufficient documentation

## 2022-10-01 DIAGNOSIS — S43431A Superior glenoid labrum lesion of right shoulder, initial encounter: Secondary | ICD-10-CM | POA: Insufficient documentation

## 2022-10-01 HISTORY — PX: SHOULDER ARTHROSCOPY WITH ROTATOR CUFF REPAIR AND SUBACROMIAL DECOMPRESSION: SHX5686

## 2022-10-01 LAB — GLUCOSE, CAPILLARY
Glucose-Capillary: 114 mg/dL — ABNORMAL HIGH (ref 70–99)
Glucose-Capillary: 102 mg/dL — ABNORMAL HIGH (ref 70–99)

## 2022-10-01 SURGERY — SHOULDER ARTHROSCOPY WITH ROTATOR CUFF REPAIR AND SUBACROMIAL DECOMPRESSION
Anesthesia: Regional | Laterality: Right

## 2022-10-01 MED ORDER — ROCURONIUM BROMIDE 10 MG/ML (PF) SYRINGE
PREFILLED_SYRINGE | INTRAVENOUS | Status: AC
  Filled 2022-10-01: qty 10

## 2022-10-01 MED ORDER — ORAL CARE MOUTH RINSE: 15.0000 mL | Freq: Once | OROMUCOSAL | Status: AC

## 2022-10-01 MED ORDER — LIDOCAINE HCL (PF) 1 % IJ SOLN
INTRAMUSCULAR | Status: AC
  Filled 2022-10-01: qty 30

## 2022-10-01 MED ORDER — FENTANYL CITRATE (PF) 100 MCG/2ML IJ SOLN
INTRAMUSCULAR | Status: DC | PRN
  Administered 2022-10-01: 50 ug via INTRAVENOUS

## 2022-10-01 MED ORDER — ACETAMINOPHEN 10 MG/ML IV SOLN
INTRAVENOUS | Status: AC
  Filled 2022-10-01: qty 100

## 2022-10-01 MED ORDER — ONDANSETRON HCL 4 MG/2ML IJ SOLN
INTRAMUSCULAR | Status: AC
  Filled 2022-10-01: qty 2

## 2022-10-01 MED ORDER — CEFAZOLIN SODIUM-DEXTROSE 2-4 GM/100ML-% IV SOLN
2.0000 g | INTRAVENOUS | Status: AC
  Administered 2022-10-01: 2 g via INTRAVENOUS
  Filled 2022-10-01: qty 100

## 2022-10-01 MED ORDER — TRIAMCINOLONE ACETONIDE 40 MG/ML IJ SUSP
INTRAMUSCULAR | Status: AC
  Filled 2022-10-01: qty 1

## 2022-10-01 MED ORDER — PROPOFOL 10 MG/ML IV BOLUS
INTRAVENOUS | Status: DC | PRN
  Administered 2022-10-01: 120 mg via INTRAVENOUS

## 2022-10-01 MED ORDER — BUPIVACAINE HCL (PF) 0.5 % IJ SOLN
INTRAMUSCULAR | Status: DC | PRN
  Administered 2022-10-01: 15 mL via PERINEURAL

## 2022-10-01 MED ORDER — BUPIVACAINE LIPOSOME 1.3 % IJ SUSP
INTRAMUSCULAR | Status: DC | PRN
  Administered 2022-10-01: 10 mL via PERINEURAL

## 2022-10-01 MED ORDER — SUGAMMADEX SODIUM 200 MG/2ML IV SOLN
INTRAVENOUS | Status: DC | PRN
  Administered 2022-10-01: 300 mg via INTRAVENOUS

## 2022-10-01 MED ORDER — DEXAMETHASONE SODIUM PHOSPHATE 10 MG/ML IJ SOLN
INTRAMUSCULAR | Status: DC | PRN
  Administered 2022-10-01: 4 mg via INTRAVENOUS

## 2022-10-01 MED ORDER — OXYCODONE-ACETAMINOPHEN 5-325 MG PO TABS: 1.0000 | ORAL_TABLET | Freq: Four times a day (QID) | ORAL | 0 refills | Status: AC | PRN

## 2022-10-01 MED ORDER — AMISULPRIDE (ANTIEMETIC) 5 MG/2ML IV SOLN: 10.0000 mg | Freq: Once | INTRAVENOUS | Status: DC | PRN

## 2022-10-01 MED ORDER — FENTANYL CITRATE PF 50 MCG/ML IJ SOSY: 25.0000 ug | PREFILLED_SYRINGE | INTRAMUSCULAR | Status: DC | PRN

## 2022-10-01 MED ORDER — LACTATED RINGERS IV SOLN: INTRAVENOUS | Status: DC

## 2022-10-01 MED ORDER — FENTANYL CITRATE (PF) 100 MCG/2ML IJ SOLN
INTRAMUSCULAR | Status: AC
  Filled 2022-10-01: qty 2

## 2022-10-01 MED ORDER — FENTANYL CITRATE PF 50 MCG/ML IJ SOSY
50.0000 ug | PREFILLED_SYRINGE | INTRAMUSCULAR | Status: AC
  Administered 2022-10-01: 50 ug via INTRAVENOUS
  Filled 2022-10-01: qty 2

## 2022-10-01 MED ORDER — PROPOFOL 10 MG/ML IV BOLUS
INTRAVENOUS | Status: AC
  Filled 2022-10-01: qty 20

## 2022-10-01 MED ORDER — ONDANSETRON HCL 4 MG/2ML IJ SOLN: 4.0000 mg | Freq: Once | INTRAMUSCULAR | Status: DC | PRN

## 2022-10-01 MED ORDER — LIDOCAINE HCL 1 % IJ SOLN
INTRAMUSCULAR | Status: DC | PRN
  Administered 2022-10-01: 7 mL

## 2022-10-01 MED ORDER — PHENYLEPHRINE HCL-NACL 20-0.9 MG/250ML-% IV SOLN
INTRAVENOUS | Status: DC | PRN
  Administered 2022-10-01: 30 ug/min via INTRAVENOUS

## 2022-10-01 MED ORDER — ACETAMINOPHEN 10 MG/ML IV SOLN
1000.0000 mg | Freq: Once | INTRAVENOUS | Status: DC | PRN
  Administered 2022-10-01: 1000 mg via INTRAVENOUS

## 2022-10-01 MED ORDER — DEXAMETHASONE SODIUM PHOSPHATE 10 MG/ML IJ SOLN
INTRAMUSCULAR | Status: AC
  Filled 2022-10-01: qty 1

## 2022-10-01 MED ORDER — ROCURONIUM BROMIDE 10 MG/ML (PF) SYRINGE
PREFILLED_SYRINGE | INTRAVENOUS | Status: DC | PRN
  Administered 2022-10-01: 60 mg via INTRAVENOUS

## 2022-10-01 MED ORDER — SODIUM CHLORIDE 0.9 % IR SOLN
Status: DC | PRN
  Administered 2022-10-01: 6000 mL

## 2022-10-01 MED ORDER — LIDOCAINE 2% (20 MG/ML) 5 ML SYRINGE
INTRAMUSCULAR | Status: DC | PRN
  Administered 2022-10-01: 40 mg via INTRAVENOUS

## 2022-10-01 MED ORDER — TRIAMCINOLONE ACETONIDE 40 MG/ML IJ SUSP
INTRAMUSCULAR | Status: DC | PRN
  Administered 2022-10-01: 40 mg

## 2022-10-01 MED ORDER — CHLORHEXIDINE GLUCONATE 0.12 % MT SOLN
15.0000 mL | Freq: Once | OROMUCOSAL | Status: AC
  Administered 2022-10-01: 15 mL via OROMUCOSAL

## 2022-10-01 MED ORDER — ONDANSETRON HCL 4 MG/2ML IJ SOLN
INTRAMUSCULAR | Status: DC | PRN
  Administered 2022-10-01: 4 mg via INTRAVENOUS

## 2022-10-01 SURGICAL SUPPLY — 64 items
AID PSTN UNV HD RSTRNT DISP (MISCELLANEOUS)
BAG COUNTER SPONGE SURGICOUNT (BAG) IMPLANT
BAG SPNG CNTER NS LX DISP (BAG)
BOOTIES KNEE HIGH SLOAN (MISCELLANEOUS) ×2 IMPLANT
BURR OVAL 8 FLU 4.0X13 (MISCELLANEOUS) ×1 IMPLANT
CANNULA 5.75X7 CRYSTAL CLEAR (CANNULA) ×1 IMPLANT
CANNULA TWIST IN 8.25X7CM (CANNULA) IMPLANT
COOLER ICEMAN CLASSIC (MISCELLANEOUS) IMPLANT
COVER SURGICAL LIGHT HANDLE (MISCELLANEOUS) ×1 IMPLANT
CUTTER BONE 4.0MM X 13CM (MISCELLANEOUS) ×1 IMPLANT
DISSECTOR  3.8MM X 13CM (MISCELLANEOUS)
DISSECTOR 3.8MM X 13CM (MISCELLANEOUS) IMPLANT
DRAPE FOOT SWITCH (DRAPES) ×1 IMPLANT
DRAPE IMP U-DRAPE 54X76 (DRAPES) ×1 IMPLANT
DRAPE INCISE IOBAN 66X45 STRL (DRAPES) IMPLANT
DRAPE ORTHO SPLIT 77X108 STRL (DRAPES) ×2
DRAPE STERI 35X30 U-POUCH (DRAPES) ×1 IMPLANT
DRAPE SURG 17X23 STRL (DRAPES) ×2 IMPLANT
DRAPE SURG ORHT 6 SPLT 77X108 (DRAPES) ×2 IMPLANT
DRAPE U-SHAPE 47X51 STRL (DRAPES) ×1 IMPLANT
DURAPREP 26ML APPLICATOR (WOUND CARE) ×1 IMPLANT
ELECT REM PT RETURN 15FT ADLT (MISCELLANEOUS) ×1 IMPLANT
GAUZE PAD ABD 8X10 STRL (GAUZE/BANDAGES/DRESSINGS) ×2 IMPLANT
GAUZE SPONGE 4X4 12PLY STRL (GAUZE/BANDAGES/DRESSINGS) ×1 IMPLANT
GAUZE XEROFORM 1X8 LF (GAUZE/BANDAGES/DRESSINGS) ×1 IMPLANT
GLOVE BIO SURGEON STRL SZ7.5 (GLOVE) ×1 IMPLANT
GLOVE BIOGEL PI IND STRL 6.5 (GLOVE) ×1 IMPLANT
GLOVE BIOGEL PI IND STRL 8 (GLOVE) ×1 IMPLANT
GLOVE SURG POLYISO LF SZ6.5 (GLOVE) ×1 IMPLANT
GOWN STRL REUS W/ TWL LRG LVL3 (GOWN DISPOSABLE) ×1 IMPLANT
GOWN STRL REUS W/ TWL XL LVL3 (GOWN DISPOSABLE) ×1 IMPLANT
GOWN STRL REUS W/TWL LRG LVL3 (GOWN DISPOSABLE) ×1
GOWN STRL REUS W/TWL XL LVL3 (GOWN DISPOSABLE) ×1
KIT BASIN OR (CUSTOM PROCEDURE TRAY) ×1 IMPLANT
KIT PUSHLOCK 2.9 HIP (KITS) IMPLANT
KIT TURNOVER KIT A (KITS) IMPLANT
LASSO 90 CVE QUICKPAS (DISPOSABLE) IMPLANT
LASSO CRESCENT QUICKPASS (SUTURE) IMPLANT
MANIFOLD NEPTUNE II (INSTRUMENTS) ×1 IMPLANT
NDL SCORPION MULTI FIRE (NEEDLE) IMPLANT
NEEDLE SCORPION MULTI FIRE (NEEDLE) IMPLANT
PACK ARTHROSCOPY WL (CUSTOM PROCEDURE TRAY) ×1 IMPLANT
PAD COLD SHLDR WRAP-ON (PAD) IMPLANT
PROBE BIPOLAR ATHRO 135MM 90D (MISCELLANEOUS) ×1 IMPLANT
PROTECTOR NERVE ULNAR (MISCELLANEOUS) ×1 IMPLANT
RESTRAINT HEAD UNIVERSAL NS (MISCELLANEOUS) IMPLANT
SLING ARM FOAM STRAP LRG (SOFTGOODS) IMPLANT
SLING ARM FOAM STRAP MED (SOFTGOODS) IMPLANT
SLING ARM IMMOBILIZER LRG (SOFTGOODS) IMPLANT
SLING ARM IMMOBILIZER MED (SOFTGOODS) IMPLANT
SUPPORT WRAP ARM LG (MISCELLANEOUS) ×1 IMPLANT
SUT ETHILON 3 0 PS 1 (SUTURE) ×1 IMPLANT
SUT PDS AB 1 CT1 27 (SUTURE) IMPLANT
SUT TIGER TAPE 7 IN WHITE (SUTURE) IMPLANT
SUTURE TAPE 1.3 40 TPR END (SUTURE) IMPLANT
SUTURETAPE 1.3 40 TPR END (SUTURE)
TAPE FIBER 2MM 7IN #2 BLUE (SUTURE) IMPLANT
TAPE LABRALWHITE 1.5X36 (TAPE) IMPLANT
TAPE SUT LABRALTAP WHT/BLK (SUTURE) IMPLANT
TOWEL OR 17X26 10 PK STRL BLUE (TOWEL DISPOSABLE) ×1 IMPLANT
TOWEL OR NON WOVEN STRL DISP B (DISPOSABLE) ×1 IMPLANT
TUBING ARTHROSCOPY IRRIG 16FT (MISCELLANEOUS) ×1 IMPLANT
TUBING CONNECTING 10 (TUBING) ×2 IMPLANT
WAND ABLATOR APOLLO I90 (BUR) IMPLANT

## 2022-10-01 NOTE — Discharge Instructions (Addendum)
Discharge Instructions after Arthroscopic Shoulder Surgery   A sling has been provided for you. You may remove the sling after 72 hours. The sling may be worn for your protection, if you are in a crowd.  Use ice on the shoulder intermittently over the first 48 hours after surgery.  Pain medication has been prescribed for you.  Use your medication liberally over the first 48 hours, and then begin to taper your use. You may take Extra Strength Tylenol or Tylenol only in place of the pain pills. DO NOT take ANY nonsteroidal anti-inflammatory pain medications: Advil, Motrin, Ibuprofen, Aleve, Naproxen, or Naprosyn.  You may remove your dressing after two days.  You may shower 5 days after surgery. The incision CANNOT get wet prior to 5 days. Simply allow the water to wash over the site and then pat dry. Do not rub the incision. Make sure your axilla (armpit) is completely dry after showering.  Resume Eliquis the day after surgery  Three to 5 times each day you should perform assisted overhead reaching and external rotation (outward turning) exercises with the operative arm. Both exercises should be done with the non-operative arm used as the "therapist arm" while the operative arm remains relaxed. Ten of each exercise should be done three to five times each day.    Overhead reach is helping to lift your stiff arm up as high as it will go. To stretch your overhead reach, lie flat on your back, relax, and grasp the wrist of the tight shoulder with your opposite hand. Using the power in your opposite arm, bring the stiff arm up as far as it is comfortable. Start holding it for ten seconds and then work up to where you can hold it for a count of 30. Breathe slowly and deeply while the arm is moved. Repeat this stretch ten times, trying to help the arm up a little higher each time.       External rotation is turning the arm out to the side while your elbow stays close to your body. External rotation  is best stretched while you are lying on your back. Hold a cane, yardstick, broom handle, or dowel in both hands. Bend both elbows to a right angle. Use steady, gentle force from your normal arm to rotate the hand of the stiff shoulder out away from your body. Continue the rotation as far as it will go comfortably, holding it there for a count of 10. Repeat this exercise ten times.     Please call [phone] during normal business hours or [phone] after hours for any problems. Including the following:  - excessive redness of the incisions - drainage for more than 4 days - fever of more than 101.5 F  *Please note that pain medications will not be refilled after hours or on weekends.    

## 2022-10-01 NOTE — H&P (Signed)
Joshua Terrell is an 77 y.o. male.   Chief Complaint: R shoulder pain and dysfunction HPI: R shoulder rotator cuff tear with dislocatoin of the biceps tendon. Failed conservative treatment.  Past Medical History:  Diagnosis Date   A-fib [facility])    Arthritis    Coronary artery disease    PTCA of the codominant LAD, 1992, CABG July 94   Diabetes mellitus without complication (HCC)    Diverticulosis    Erectile dysfunction    HTN (hypertension)    Hyperlipidemia    Obesity    OSA (obstructive sleep apnea)    cpap   Tinnitus     Past Surgical History:  Procedure Laterality Date   CORONARY ANGIOPLASTY     CORONARY ARTERY BYPASS GRAFT     hernia repair and undescended testicle surgery      VASECTOMY      Family History  Problem Relation Age of Onset   Healthy Mother    Heart attack Father    Heart disease Father    Healthy Sister    Other Brother        CAR ACCIDENT   Healthy Sister    Social History:  reports that he has quit smoking. He has never used smokeless tobacco. He reports that he does not currently use alcohol. He reports that he does not use drugs.  Allergies: No Known Allergies  No medications prior to admission.    No results found for this or any previous visit (from the past 48 hour(s)). No results found.  Review of Systems  All other systems reviewed and are negative.   There were no vitals taken for this visit. Physical Exam Constitutional:      Appearance: He is well-developed.  HENT:     Head: Atraumatic.  Eyes:     Extraocular Movements: Extraocular movements intact.  Cardiovascular:     Pulses: Normal pulses.  Pulmonary:     Effort: Pulmonary effort is normal.  Musculoskeletal:     Comments: R shoulder pain with RC and biceps testing. NVID  Skin:    General: Skin is warm and dry.  Neurological:     Mental Status: He is alert and oriented to person, place, and time.  Psychiatric:        Mood and Affect: Mood normal.       Assessment/Plan R shoulder rotator cuff tear with dislocatoin of the biceps tendon. Failed conservative treatment. Plan R arth RC debridement vs repair, SAD, Biceps tenotomy. Risks / benefits of surgery discussed Consent on chart  NPO for OR Preop antibiotics    Glennon Hamilton, MD 10/01/2022, 8:47 AM

## 2022-10-01 NOTE — Op Note (Signed)
Procedure(s): SHOULDER ARTHROSCOPY WITH ROTATOR CUFF REPAIR VERSUS DEBRIDEMNENT,  SUBACROMIAL DECOMPRESSION, BICEPS TENOTOMY Procedure Note  Joshua Terrell male 77 y.o. 10/01/2022  Preoperative diagnosis: #1 right shoulder partial-thickness rotator cuff tear #2 right shoulder dislocation of the long head biceps tendon #3 possible right shoulder impingement  Postoperative diagnosis: #1 right shoulder partial-thickness subscapularis tear #2 right shoulder superior labral tear with dislocation of the long head biceps tendon #3 extensive glenohumeral synovitis with adhesive capsulitis #4 no evidence for significant bony impingement  Procedure performed: #1 right shoulder extensive debridement of partial rotator cuff tear, synovitis, subacromial bursitis, biceps tendon #2 right shoulder manipulation under anesthesia #3 right shoulder proximal long head biceps tenotomy #4 right shoulder intra-articular corticosteroid injection   Surgeon(s) and Role:    Jones Broom, MD - Primary     Surgeon: Glennon Hamilton   Assistants: Fredia Sorrow PA-C [name] was present and scrubbed throughout the procedure and was essential in positioning, assisting with the camera and instrumentation,, and closure)  Anesthesia: General endotracheal anesthesia with preoperative interscalene block given by the attending anesthesiologist    Procedure Detail  SHOULDER ARTHROSCOPY WITH ROTATOR CUFF REPAIR VERSUS DEBRIDEMNENT,  SUBACROMIAL DECOMPRESSION, BICEPS TENOTOMY  Estimated Blood Loss: Min         Drains: none  Blood Given: none         Specimens: none        Complications:  * No complications entered in OR log *         Disposition: PACU - hemodynamically stable.         Condition: stable    Procedure:   INDICATIONS FOR SURGERY: The patient is 77 y.o. male who who has had several months of significant right shoulder pain which has been refractory to nonoperative management.  He had  temporary improvement with a corticosteroid injection but has had significant recurrence of symptoms that significantly hinder his quality of life and his sleep at night.  Indicated for surgical treatment to try and decrease pain and improve function.  OPERATIVE FINDINGS: Examination under anesthesia: He was noted to have stiffness in forward flexion limited to about 130 degrees.  With gentle manipulation under anesthesia there was satisfactory lysis of adhesions and recovery of motion.   DESCRIPTION OF PROCEDURE: The patient was identified in preoperative  holding area where I personally marked the operative site after  verifying site, side, and procedure with the patient. An interscalene block was given by the attending anesthesiologist the holding area.  The patient was taken back to the operating room where general anesthesia was induced without complication and was placed in the beach-chair position with the back  elevated about 60 degrees and all extremities and head and neck carefully padded and  positioned.   The right upper extremity was then prepped and  draped in a standard sterile fashion. The appropriate time-out  procedure was carried out. The patient did receive IV antibiotics  within 30 minutes of incision.   A small posterior portal incision was made and the arthroscope was introduced into the joint. An anterior portal was then established above the subscapularis using needle localization. Small cannula was placed anteriorly. Diagnostic arthroscopy was then carried out  He was noted to have significant synovitis throughout the joint.  The biceps tendon was noted to have compromise of the origin of the superior labrum which was debrided.  The tendon was pulled into the joint noted to have both subluxation and to some partial tearing of the  subscapularis as well as severe partial tearing of the tendon and tenosynovitis.  This was felt to be significant pain generator and therefore  through a small anterior incision Mayo scissors were used to release the biceps tendon from the superior labrum and it was allowed to retract from the joint.  The shaver and ArthroCare were used extensively in the joint to debride the synovitis.  The partial articular sided tearing of the subscapularis was debrided back to stable tendon.  No formal repair was necessary.  The undersurface of the supraspinatus was examined and noted to be intact.  There was some chondromalacia grade 3 on the humeral head posteriorly which was noted and lightly debrided with a shaver.  The arthroscope was then introduced into the subacromial space a standard lateral portal was established with needle localization. The shaver was used through the lateral portal to perform extensive bursectomy.  The bursal surface of the rotator cuff was completely intact and there was no significant bony impingement from the acromion.  The arthroscopic equipment was removed from the joint and the portals were closed with 3-0 nylon in an interrupted fashion. Sterile dressings were then applied including Xeroform 4 x 4's ABDs and tape. The patient was then allowed to awaken from general anesthesia, placed in a sling, transferred to the stretcher and taken to the recovery room in stable condition.   POSTOPERATIVE PLAN: The patient will be discharged home today and will followup in one week for suture removal and wound check.  We will get him back into therapy.

## 2022-10-01 NOTE — Anesthesia Procedure Notes (Signed)
Procedure Name: Intubation Date/Time: 10/01/2022 11:22 AM  Performed by: Sindy Guadeloupe, CRNAPre-anesthesia Checklist: Patient identified, Emergency Drugs available, Suction available, Patient being monitored and Timeout performed Patient Re-evaluated:Patient Re-evaluated prior to induction Oxygen Delivery Method: Circle system utilized Preoxygenation: Pre-oxygenation with 100% oxygen Induction Type: IV induction Ventilation: Mask ventilation without difficulty Laryngoscope Size: Mac and 4 Grade View: Grade I Tube type: Oral Tube size: 7.5 mm Number of attempts: 1 Airway Equipment and Method: Stylet Placement Confirmation: ETT inserted through vocal cords under direct vision, positive ETCO2 and breath sounds checked- equal and bilateral Secured at: 23 cm Tube secured with: Tape Dental Injury: Teeth and Oropharynx as per pre-operative assessment

## 2022-10-01 NOTE — Transfer of Care (Signed)
Immediate Anesthesia Transfer of Care Note  Patient: Joshua Terrell  Procedure(s) Performed: SHOULDER ARTHROSCOPY WITH ROTATOR CUFF REPAIR VERSUS DEBRIDEMNENT,  SUBACROMIAL DECOMPRESSION, BICEPS TENOTOMY (Right)  Patient Location: PACU  Anesthesia Type:General  Level of Consciousness: awake, alert , and patient cooperative  Airway & Oxygen Therapy: Patient Spontanous Breathing and Patient connected to face mask oxygen  Post-op Assessment: Report given to RN and Post -op Vital signs reviewed and stable  Post vital signs: Reviewed and stable  Last Vitals:  Vitals Value Taken Time  BP 140/78 10/01/22 1210  Temp    Pulse 58 10/01/22 1212  Resp 12   SpO2 99 % 10/01/22 1212  Vitals shown include unvalidated device data.  Last Pain:  Vitals:   10/01/22 1100  TempSrc:   PainSc: 0-No pain         Complications: No notable events documented.

## 2022-10-01 NOTE — Anesthesia Procedure Notes (Signed)
Anesthesia Regional Block: Interscalene brachial plexus block   Pre-Anesthetic Checklist: , timeout performed,  Correct Patient, Correct Site, Correct Laterality,  Correct Procedure, Correct Position, site marked,  Risks and benefits discussed,  Surgical consent,  Pre-op evaluation,  At surgeon's request and post-op pain management  Laterality: Right  Prep: chloraprep       Needles:  Injection technique: Single-shot  Needle Type: Echogenic Stimulator Needle     Needle Length: 9cm  Needle Gauge: 21     Additional Needles:   Procedures:,,,, ultrasound used (permanent image in chart),,    Narrative:  Start time: 10/01/2022 10:40 AM End time: 10/01/2022 10:50 AM Injection made incrementally with aspirations every 5 mL.  Performed by: Personally  Anesthesiologist: Leonides Grills, MD  Additional Notes: Functioning IV was confirmed and monitors were applied.  A timeout was performed. Sterile prep, hand hygiene and sterile gloves were used. A 90mm 21ga Arrow echogenic stimulator needle was used. Negative aspiration and negative test dose prior to incremental administration of local anesthetic. The patient tolerated the procedure well.  Ultrasound guidance: relevent anatomy identified, needle position confirmed, local anesthetic spread visualized around nerve(s), vascular puncture avoided.  Image printed for medical record.

## 2022-10-01 NOTE — Anesthesia Postprocedure Evaluation (Signed)
Anesthesia Post Note  Patient: Joshua Terrell  Procedure(s) Performed: SHOULDER ARTHROSCOPY WITH ROTATOR CUFF REPAIR VERSUS DEBRIDEMNENT,  SUBACROMIAL DECOMPRESSION, BICEPS TENOTOMY (Right)     Patient location during evaluation: PACU Anesthesia Type: Regional and General Level of consciousness: awake Pain management: pain level controlled Vital Signs Assessment: post-procedure vital signs reviewed and stable Respiratory status: spontaneous breathing, nonlabored ventilation and respiratory function stable Cardiovascular status: blood pressure returned to baseline and stable Postop Assessment: no apparent nausea or vomiting Anesthetic complications: no   No notable events documented.  Last Vitals:  Vitals:   10/01/22 1249 10/01/22 1345  BP: 125/65 129/62  Pulse: (!) 59 (!) 52  Resp: 14 12  Temp: (!) 36.4 C 36.4 C  SpO2: 96% 95%    Last Pain:  Vitals:   10/01/22 1345  TempSrc:   PainSc: 2                  [name] P Oneal Schoenberger

## 2022-10-01 NOTE — Evaluation (Signed)
Occupational Therapy Evaluation Patient Details Name: Joshua Terrell MRN: [id number] DOB: [birth year] Today's Date: 10/01/2022   History of Present Illness Mr. Ragsdale is a 77 yr old male who is s/p R shoulder debridement of rotator cuff tear and biceps tenotomy on 10-01-2022 , due to R shoulder partial thickness rotator cuff tear, possible impingement, and dislocation of long head biceps tendon.   Clinical Impression   Patient is s/p R shoulder debridement of rotator cuff tear and biceps tenotomy, without functional use of right dominant upper extremity secondary to effects of surgery, interscalene block, and post-op precautions. Therapist provided education and instruction to patient and his spouse with regards to UE ROM/exercises, post-op precautions, UE positioning, donning upper extremity clothing, use of ice for pain and edema management, correct use of ice machine, and correctly donning/doffing sling. Patient and spouse verbalized understanding and demonstrated as needed. Patient needed assistance to donn shirt, underwear, pants, and shoes with instruction provided on compensatory strategies to perform ADLs. Patient to follow up with MD for further therapy needs.        Recommendations for follow up therapy are one component of a multi-disciplinary discharge planning process, led by the attending physician.  Recommendations may be updated based on patient status, additional functional criteria and insurance authorization.   Assistance Recommended at Discharge Intermittent Supervision/Assistance  Patient can return home with the following Assistance with cooking/housework;Assist for transportation;Help with stairs or ramp for entrance;A little help with bathing/dressing/bathroom    Functional Status Assessment  Patient has had a recent decline in their functional status and demonstrates the ability to make significant improvements in function in a reasonable and predictable amount of time.   Equipment Recommendations  None recommended by OT       Precautions / Restrictions Precautions Precautions: Shoulder Shoulder Interventions: Shoulder sling/immobilizer Precaution Booklet Issued: Yes (comment) Restrictions Other Position/Activity Restrictions: Orders received via secure chat from patient's PA. Sling to be worn until nerve block wears off, okay to perform AROM of hand, wrist, and elbow, perform PROM of shoulder until nerve block wears off then okay to perform AROM of shoulder      Mobility Bed Mobility      General bed mobility comments: Pt was received seated in the chair    Transfers   Equipment used: None Transfers: Sit to/from Stand Sit to Stand: Supervision                  Balance Overall balance assessment: No apparent balance deficits (not formally assessed)           ADL either performed or assessed with clinical judgement      Vision Patient Visual Report: No change from baseline              Pertinent Vitals/Pain Pain Assessment Pain Assessment: No/denies pain     Hand Dominance Right      Communication Communication Communication: HOH   Cognition Arousal/Alertness: Awake/alert Behavior During Therapy: WFL for tasks assessed/performed Overall Cognitive Status: Within Functional Limits for tasks assessed                  Shoulder Instructions Shoulder Instructions Donning/doffing shirt without moving shoulder: Minimal assistance Donning/doffing sling/immobilizer: Minimal assistance Correct positioning of sling/immobilizer: Minimal assistance ROM for elbow, wrist and digits of operated UE: Caregiver independent with task Sling wearing schedule (on at all times/off for ADL's): Caregiver independent with task Positioning of UE while sleeping: Caregiver independent with task    Home Living Family/patient expects  to be discharged to:: Private residence Living Arrangements: Spouse/significant other Available Help  at Discharge: Family Type of Home: House Home Access: Stairs to enter Secretary/administrator of Steps: 1   Home Layout: One level      Home Equipment: Agricultural consultant (2 wheels)          Prior Functioning/Environment Prior Level of Function : Independent/Modified Independent;Driving             Mobility Comments: He was independent with ambulation. ADLs Comments: He was independent with ADLs & driving.        OT Problem List: Impaired UE functional use      OT Treatment/Interventions:   No further OT treatment needs identified in the acute care setting      OT Frequency:  N/A       AM-PAC OT "6 Clicks" Daily Activity     Outcome Measure Help from another person eating meals?: None Help from another person taking care of personal grooming?: None Help from another person toileting, which includes using toliet, bedpan, or urinal?: A Little Help from another person bathing (including washing, rinsing, drying)?: A Little Help from another person to put on and taking off regular upper body clothing?: A Little Help from another person to put on and taking off regular lower body clothing?: A Little 6 Click Score: 20   End of Session Nurse Communication: Other (comment) (Shoulder education completed)  Activity Tolerance: Patient tolerated treatment well Patient left: in chair;with family/visitor present  OT Visit Diagnosis: Muscle weakness (generalized) (M62.81)                Time: 1610-9604 OT Time Calculation (min): 36 min Charges:  OT General Charges $OT Visit: 1 Visit OT Evaluation $OT Eval Low Complexity: 1 Low OT Treatments $Self Care/Home Management : 8-22 mins    Reuben Likes, OTR/L 10/01/2022, 3:17 PM

## 2022-10-02 ENCOUNTER — Encounter (HOSPITAL_COMMUNITY): Payer: Self-pay | Admitting: Orthopedic Surgery

## 2022-12-09 ENCOUNTER — Ambulatory Visit
Admission: RE | Admit: 2022-12-09 | Discharge: 2022-12-09 | Disposition: A | Discharge status: Home / Self Care | Payer: PPO | Source: Ambulatory Visit | Attending: Internal Medicine | Admitting: Internal Medicine

## 2022-12-09 ENCOUNTER — Other Ambulatory Visit: Payer: Self-pay | Admitting: Internal Medicine

## 2022-12-09 DIAGNOSIS — R0989 Other specified symptoms and signs involving the circulatory and respiratory systems: Secondary | ICD-10-CM

## 2023-03-24 NOTE — Progress Notes (Unsigned)
Cardiology Office Note    Patient Name: Joshua Terrell Date of Encounter: 03/24/2023  Primary Care Provider:  Emilio Aspen, MD Primary Cardiologist:  Donato Schultz, MD Primary Electrophysiologist: None   Past Medical History    Past Medical History:  Diagnosis Date   A-fib [facility])    Arthritis    Coronary artery disease    PTCA of the codominant LAD, 1992, CABG July 94   Diabetes mellitus without complication (HCC)    Diverticulosis    Erectile dysfunction    HTN (hypertension)    Hyperlipidemia    Obesity    OSA (obstructive sleep apnea)    cpap   Tinnitus     History of Present Illness  Joshua Terrell is a 77 y.o. male with a PMH of CAD s/p CABG 1994 (SVG to OM, SVG to RCA, and SVG to diagonal, and atretic LIMA to LAD), atypical atrial flutter, OSA (on CPAP), HLD, HTN, arthritis who presents today for 46-month follow-up.  [name] was previously followed by Dr. Katrinka Blazing and is currently followed by Dr. Anne Fu for management of atypical flutter and coronary disease.  He underwent a CABG x 4 in 1994 with LHC performed in 2012 and most recently in 2014 revealing patent grafts with a atretic LIMA to LAD supplied retrograde.  He was diagnosed with atypical atrial flutter in 2017 and was placed on amiodarone as well as Eliquis.  He was seen in follow-up on 05/2020 with complaints of exertional shortness of breath and chest tightness.  He underwent a Lexiscan Myoview that was low risk and showed no ischemia.  He was started on Imdur 30 mg daily.  He was seen initially by Dr. 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  He had no medication changes made at that time and was found to be compliant with CPAP and blood pressure was controlled.   During today's visit the  patient reports*** .  Patient denies chest pain, palpitations, dyspnea, PND, orthopnea, nausea, vomiting, dizziness, syncope, edema, weight gain, or early satiety.  ***Notes: -Last ischemic evaluation: -Last echo: -Interim ED visits: Review of Systems  Please see the history of present illness.    All other systems reviewed and are otherwise negative except as noted above.  Physical Exam    Wt Readings from Last 3 Encounters:  09/25/22 252 lb (114.3 kg)  09/09/22 259 lb 3.2 oz (117.6 kg)  08/05/21 260 lb 3.2 oz (118 kg)   WU:JWJXB were no vitals filed for this visit.,There is no height or weight on file to calculate BMI. GEN: Well nourished, well developed in no acute distress Neck: No JVD; No carotid bruits Pulmonary: Clear to auscultation without rales, wheezing or rhonchi  Cardiovascular: Normal rate. Regular rhythm. Normal S1. Normal S2.   Murmurs: There is no murmur.  ABDOMEN: Soft, non-tender, non-distended EXTREMITIES:  No edema; No deformity   EKG/LABS/ Recent Cardiac Studies   ECG personally reviewed by me today - ***  Risk Assessment/Calculations:   {Does this patient have ATRIAL FIBRILLATION?:[id number]}      Lab Results  Component Value Date   WBC 7.0 09/25/2022   HGB 13.2 09/25/2022   HCT 41.5 09/25/2022   MCV 92.6 09/25/2022   PLT 252 09/25/2022   Lab Results  Component Value  Date   CREATININE 1.24 09/25/2022   BUN 27 (H) 09/25/2022   NA 140 09/25/2022   K 4.4 09/25/2022   CL 108 09/25/2022   CO2 19 (L) 09/25/2022   Lab Results  Component Value Date   CHOL 114 09/09/2022   HDL 59 09/09/2022   LDLCALC 40 09/09/2022   TRIG 73 09/09/2022   CHOLHDL 1.9 09/09/2022    Lab Results  Component Value Date   HGBA1C 6.8 (H) 09/25/2022   Assessment & Plan    1.  History of CAD: -s/p CABG in 1994 with most recent LHC completed in 2014 showing patent grafts with a atretic LIMA to LAD graft -Today patient reports***  2.  HFpEF: -2D echo completed  07/2021 with EF of 55 to 60% and no significant valvular abnormalities. -Today patient is*** -Continue***  3.  Essential hypertension: -Patient's blood pressure today was***  4.  Atypical atrial flutter: -Patient is currently on amiodarone 100 mg daily, Cardizem 180 mg daily  5.  Hyperlipidemia: -Patient's LDL cholesterol was***      Disposition: Follow-up with Donato Schultz, MD or APP in *** months {Are you ordering a CV Procedure (e.g. stress test, cath, DCCV, TEE, etc)?   Press F2        :[id number]}   Signed, Napoleon Form, Leodis Rains, NP 03/24/2023, 1:14 PM [facility]

## 2023-03-25 ENCOUNTER — Encounter: Payer: Self-pay | Admitting: Nurse Practitioner

## 2023-03-25 ENCOUNTER — Ambulatory Visit: Payer: PPO | Attending: Nurse Practitioner | Admitting: Nurse Practitioner

## 2023-03-25 VITALS — BP 142/80 | HR 90 | Ht 72.0 in | Wt 260.0 lb

## 2023-03-25 DIAGNOSIS — E785 Hyperlipidemia, unspecified: Secondary | ICD-10-CM

## 2023-03-25 DIAGNOSIS — I4892 Unspecified atrial flutter: Secondary | ICD-10-CM | POA: Diagnosis not present

## 2023-03-25 DIAGNOSIS — I5032 Chronic diastolic (congestive) heart failure: Secondary | ICD-10-CM | POA: Diagnosis not present

## 2023-03-25 DIAGNOSIS — I25708 Atherosclerosis of coronary artery bypass graft(s), unspecified, with other forms of angina pectoris: Secondary | ICD-10-CM

## 2023-03-25 DIAGNOSIS — I10 Essential (primary) hypertension: Secondary | ICD-10-CM | POA: Diagnosis not present

## 2023-03-25 NOTE — Patient Instructions (Signed)
Medication Instructions:  Your physician recommends that you continue on your current medications as directed. Please refer to the Current Medication list given to you today. *If you need a refill on your cardiac medications before your next appointment, please call your pharmacy*   Lab Work: None ordered   Testing/Procedures: None ordered   Follow-Up: At [facility], you and your health needs are our priority.  As part of our continuing mission to provide you with exceptional heart care, we have created designated Provider Care Teams.  These Care Teams include your primary Cardiologist (physician) and Advanced Practice Providers (APPs -  Physician Assistants and Nurse Practitioners) who all work together to provide you with the care you need, when you need it.  We recommend signing up for the patient portal called "MyChart".  Sign up information is provided on this After Visit Summary.  MyChart is used to connect with patients for Virtual Visits (Telemedicine).  Patients are able to view lab/test results, encounter notes, upcoming appointments, etc.  Non-urgent messages can be sent to your provider as well.   To learn more about what you can do with MyChart, go to [email or web address].    Your next appointment:   12 month(s)  Provider:   Donato Schultz, MD  or Robin Searing, NP   Other Instructions

## 2023-08-19 ENCOUNTER — Other Ambulatory Visit: Payer: Self-pay | Admitting: Nurse Practitioner

## 2023-09-08 ENCOUNTER — Other Ambulatory Visit: Payer: Self-pay | Admitting: Cardiology

## 2024-02-09 ENCOUNTER — Other Ambulatory Visit: Payer: Self-pay | Admitting: Nurse Practitioner

## 2024-03-06 ENCOUNTER — Other Ambulatory Visit: Payer: Self-pay | Admitting: Cardiology

## 2024-03-10 ENCOUNTER — Telehealth: Payer: Self-pay | Admitting: Cardiology

## 2024-03-10 ENCOUNTER — Other Ambulatory Visit (HOSPITAL_COMMUNITY): Payer: Self-pay

## 2024-03-10 ENCOUNTER — Other Ambulatory Visit: Payer: Self-pay | Admitting: *Deleted

## 2024-03-10 MED ORDER — NITROGLYCERIN 0.4 MG SL SUBL: 0.4000 mg | SUBLINGUAL_TABLET | SUBLINGUAL | 3 refills | Status: AC | PRN

## 2024-03-10 MED ORDER — NITROGLYCERIN 0.4 MG SL SUBL
0.4000 mg | SUBLINGUAL_TABLET | SUBLINGUAL | 3 refills | Status: DC | PRN
  Filled 2024-03-10: qty 25, 8d supply, fill #0

## 2024-03-10 MED ORDER — AMIODARONE HCL 200 MG PO TABS: 100.0000 mg | ORAL_TABLET | Freq: Every day | ORAL | 0 refills | Status: DC

## 2024-03-10 NOTE — Telephone Encounter (Signed)
 Ok with NTG Rx Thanks Oneil Parchment, MD  SL Ntg rx sent into pharmacy as instructed.   Pt  awaiting an appt to be scheduled.  Message sent to scheduling for next available.

## 2024-03-10 NOTE — Telephone Encounter (Signed)
 Pt c/o of Chest Pain: STAT if active (IN THIS MOMENT) CP, including tightness, pressure, jaw pain, shoulder/upper arm/back pain, SOB, nausea, and vomiting.  1. Are you having CP right now (tightness, pressure, or discomfort)?  Wife says last night patient finally complained of chest tightness. No complaints of pain.  2. Are you experiencing any other symptoms (ex. SOB, nausea, vomiting, sweating)?  Not that wife is aware of  3. How long have you been experiencing CP?  Wife unsure. She says patient doesn't really say when something is wrong.  4. Is your CP continuous or coming and going?  Coming and going   5. Have you taken Nitroglycerin ?  Wife says she saw patient take a nitroglycerin  underneath his tongue

## 2024-03-10 NOTE — Telephone Encounter (Signed)
 Pt having chest pressure in the center of his chest.  Occurs occasionally, maybe every 2 weeks. Describes it as a constant pressure, 4/10, non radiating.  Denies edema, new/worsening sob,  wt gain and/or dizziness/lightheadedness.  Patient also reports that he took 2 nitroglycerin  at one time, however it did not work for him.  Educated pt to nitroglycerin  - how to take it and when to take it.  Patient reports this did not work for him -- our system shows that we haven't send in refill for this since 2021 and educated that efficacy of the medication was poor/non existent being that it was years old.  Aware will forward to MD for advisement and if ok to send in refill for nitroglycerin . Aware forwarding to schedulers to arrange next available for pt to be seen.   Pt almost out of Amiodarone  -- says Rx sent in will only last for 30 days.  Aware I will send in a 90 day to pharmacy to get him through until appt.   They appreciate the help with this.

## 2024-03-22 NOTE — Telephone Encounter (Signed)
 Appt has been scheduled for 04/04/24

## 2024-04-04 ENCOUNTER — Encounter: Payer: Self-pay | Admitting: Cardiology

## 2024-04-04 ENCOUNTER — Ambulatory Visit: Attending: Cardiology | Admitting: Cardiology

## 2024-04-04 VITALS — BP 131/65 | HR 60 | Ht 72.0 in | Wt 261.0 lb

## 2024-04-04 DIAGNOSIS — I4892 Unspecified atrial flutter: Secondary | ICD-10-CM

## 2024-04-04 DIAGNOSIS — I10 Essential (primary) hypertension: Secondary | ICD-10-CM

## 2024-04-04 DIAGNOSIS — I25708 Atherosclerosis of coronary artery bypass graft(s), unspecified, with other forms of angina pectoris: Secondary | ICD-10-CM | POA: Diagnosis not present

## 2024-04-04 NOTE — Progress Notes (Signed)
 Cardiology Office Note:  .   Date:  04/04/2024  ID:  Joshua Terrell, DOB [birth year], MRN [id number] PCP: Joshua Terrell LABOR, MD  [facility] Providers Cardiologist:  Oneil Parchment, MD    History of Present Illness: .   Joshua Terrell is a 78 y.o. male Discussed the use of AI scribe   History of Present Illness Joshua Terrell is an 78 year old male with coronary artery disease who presents for follow-up.  He has a history of coronary artery disease and underwent coronary artery bypass grafting (CABG) with saphenous vein grafts to the obtuse marginal, right coronary artery, and diagonal, as well as a left internal mammary artery graft to the left anterior descending artery. The LIMA to LAD is atretic. He experiences occasional chest tightness that is brief and has not used nitroglycerin  despite having a prescription for five years, as it did not alleviate the tightness when tried once. No current chest pain or shortness of breath.  He has a history of atypical atrial flutter, which spontaneously converted, and is on chronic anticoagulation with Eliquis and antiarrhythmic therapy with amiodarone  100 mg daily. No episodes of syncope or major bleeding, although he bleeds easily from minor scrapes. His hemoglobin is 12.6, and he is taking slow-release iron supplements for mild anemia.  He has chronic diastolic heart failure and is on Jardiance and an ACE inhibitor. His echocardiogram in 2023 showed normal pump function with a normal ejection fraction. He also has first-degree atrioventricular block, which persists.  He has a history of hypertension, hyperlipidemia, and obstructive sleep apnea, for which he uses CPAP. He is extremely hard of hearing and mentions having a 'rusted eardrum.' He has not had any heart attacks to his knowledge but was informed of having had two small strokes without being aware of any symptoms.      Studies Reviewed: SABRA   EKG  Interpretation Date/Time:  Tuesday April 04 2024 14:15:15 EST Ventricular Rate:  60 PR Interval:  312 QRS Duration:  84 QT Interval:  416 QTC Calculation: 416 R Axis:   68  Text Interpretation: Unusual P axis, possible ectopic atrial rhythm First degree A-V block Low voltage QRS When compared with ECG of 18-Nov-2015 08:04, Ectopic atrial rhythm has replaced Sinus rhythm Confirmed by Parchment Oneil ([id number]) on 04/04/2024 2:25:58 PM    Results LABS Hemoglobin: 12.6 g/dL Creatinine: 1.2 mg/dL  DIAGNOSTIC Echocardiogram (2023): Normal systolic function EKG: Normal Risk Assessment/Calculations:            Physical Exam:   VS:  BP 131/65   Pulse 60   Ht 6' (1.829 m)   Wt 261 lb (118.4 kg)   SpO2 95%   BMI 35.40 kg/m    Wt Readings from Last 3 Encounters:  04/04/24 261 lb (118.4 kg)  03/25/23 260 lb (117.9 kg)  09/25/22 252 lb (114.3 kg)    GEN: Well nourished, well developed in no acute distress NECK: No JVD; No carotid bruits CARDIAC: RRR, no murmurs, no rubs, no gallops RESPIRATORY:  Clear to auscultation without rales, wheezing or rhonchi  ABDOMEN: Soft, non-tender, non-distended EXTREMITIES:  No edema; No deformity   ASSESSMENT AND PLAN: .    Assessment and Plan Assessment & Plan Coronary artery disease status post CABG with angina Status post CABG with SVG to OM, SVG to RCA, SVG to diagonal, and LIMA to LAD, with LIMA to LAD atretic. No current chest pain or shortness of breath. Occasional tightness in the chest  area, but no nitroglycerin  use in the past five years. - Continue current management and monitoring.  Atrial flutter on chronic anticoagulation and antiarrhythmic therapy Prior atypical atrial flutter spontaneously converted. Currently on amiodarone  100 mg daily and apixaban. No major bleeding events reported. EKG is stable. - Continue amiodarone  100 mg daily.  Watch with first-degree AV block.  No high-grade symptoms such as syncope.  Continue with  amiodarone  labs. - Continue apixaban as prescribed.  Chronic diastolic heart failure with preserved ejection fraction Chronic diastolic heart failure with preserved ejection fraction. Echocardiogram in 2023 showed normal pump function. Managed with Jardiance and ACE inhibitor. - Continue current heart failure management regimen.  Essential hypertension Managed with current medication regimen. - Continue current antihypertensive therapy.  Obstructive sleep apnea on CPAP therapy Obstructive sleep apnea managed with CPAP therapy. - Continue CPAP therapy.  First-degree atrioventricular block First-degree AV block present. No new symptoms reported. - Continue monitoring.  Hyperlipidemia Managed with atorvastatin. - Continue atorvastatin therapy.  Mild anemia Hemoglobin at 12.6. Managed with slow-release iron supplementation. Reports fatigue. - Continue slow-release iron supplementation.         Dispo: 1 yr  Signed, Oneil Parchment, MD

## 2024-04-04 NOTE — Patient Instructions (Signed)

## 2024-04-17 ENCOUNTER — Telehealth: Payer: Self-pay | Admitting: Cardiology

## 2024-04-17 MED ORDER — AMIODARONE HCL 200 MG PO TABS: 100.0000 mg | ORAL_TABLET | Freq: Every day | ORAL | 3 refills | Status: AC

## 2024-04-17 NOTE — Telephone Encounter (Signed)
*  STAT* If patient is at the pharmacy, call can be transferred to refill team.   1. Which medications need to be refilled? (please list name of each medication and dose if known)   amiodarone  (PACERONE ) 200 MG tablet    2. Which pharmacy/location (including street and city if local pharmacy) is medication to be sent to? [facility] - [place], [place] - [address] [address]    3. Do they need a 30 day or 90 day supply?  90 day supply

## 2024-04-17 NOTE — Telephone Encounter (Signed)
 Refill sent.

## 2024-05-12 ENCOUNTER — Other Ambulatory Visit: Payer: Self-pay | Admitting: Nurse Practitioner

## 2024-05-19 ENCOUNTER — Encounter (INDEPENDENT_AMBULATORY_CARE_PROVIDER_SITE_OTHER): Payer: Self-pay

## 2024-06-15 ENCOUNTER — Other Ambulatory Visit (HOSPITAL_COMMUNITY)
Admission: RE | Admit: 2024-06-15 | Discharge: 2024-06-15 | Disposition: A | Source: Ambulatory Visit | Attending: Physician Assistant | Admitting: Physician Assistant

## 2024-06-15 ENCOUNTER — Ambulatory Visit (INDEPENDENT_AMBULATORY_CARE_PROVIDER_SITE_OTHER): Admitting: Physician Assistant

## 2024-06-15 ENCOUNTER — Encounter (INDEPENDENT_AMBULATORY_CARE_PROVIDER_SITE_OTHER): Payer: Self-pay | Admitting: Physician Assistant

## 2024-06-15 VITALS — BP 124/62 | HR 60 | Temp 97.5°F | Ht 72.0 in | Wt 254.0 lb

## 2024-06-15 DIAGNOSIS — H6061 Unspecified chronic otitis externa, right ear: Secondary | ICD-10-CM | POA: Insufficient documentation

## 2024-06-15 DIAGNOSIS — H9193 Unspecified hearing loss, bilateral: Secondary | ICD-10-CM | POA: Diagnosis not present

## 2024-06-15 DIAGNOSIS — H9313 Tinnitus, bilateral: Secondary | ICD-10-CM

## 2024-06-15 MED ORDER — CIPROFLOXACIN-DEXAMETHASONE 0.3-0.1 % OT SUSP: 4.0000 [drp] | Freq: Two times a day (BID) | OTIC | 0 refills | Status: AC

## 2024-06-15 NOTE — Progress Notes (Unsigned)
aero

## 2024-06-16 ENCOUNTER — Other Ambulatory Visit (INDEPENDENT_AMBULATORY_CARE_PROVIDER_SITE_OTHER): Payer: Self-pay

## 2024-06-16 ENCOUNTER — Telehealth (INDEPENDENT_AMBULATORY_CARE_PROVIDER_SITE_OTHER): Payer: Self-pay | Admitting: Physician Assistant

## 2024-06-16 ENCOUNTER — Other Ambulatory Visit (INDEPENDENT_AMBULATORY_CARE_PROVIDER_SITE_OTHER): Payer: Self-pay | Admitting: Physician Assistant

## 2024-06-16 DIAGNOSIS — H6061 Unspecified chronic otitis externa, right ear: Secondary | ICD-10-CM

## 2024-06-19 NOTE — Telephone Encounter (Signed)
 error

## 2024-06-23 ENCOUNTER — Telehealth (INDEPENDENT_AMBULATORY_CARE_PROVIDER_SITE_OTHER): Payer: Self-pay | Admitting: Physician Assistant

## 2024-06-23 MED ORDER — NEOMYCIN-POLYMYXIN-HC 3.5-10000-1 OT SOLN: 4.0000 [drp] | Freq: Four times a day (QID) | OTIC | 0 refills | Status: AC

## 2024-06-23 NOTE — Telephone Encounter (Signed)
 Spoke with Joshua Terrell, he still having some drainage out of his right ear although not as significant as previously.  He is having a difficult time giving me a firm answer on this.  I reviewed his urine culture which shows that the staph is resistant to ciprofloxacin .  I will switch him over to Cortisporin as he had an intact tympanic membrane of his last office visit.  He will use that through the weekend and touch base with me early next week to inform me of his symptoms.  He also has a scheduled visit next week.

## 2024-06-26 LAB — ANAEROBIC CULTURE W GRAM STAIN: Gram Stain: NONE SEEN

## 2024-06-28 ENCOUNTER — Telehealth (INDEPENDENT_AMBULATORY_CARE_PROVIDER_SITE_OTHER): Payer: Self-pay | Admitting: Physician Assistant

## 2024-06-28 NOTE — Telephone Encounter (Signed)
 Patient called 06/28/2024 at 2:10 PM. Patient stated that they were returning a call to PA Hedges with an update about the fluid coming out of his ears. Patient asked for PA Hedges to give him a call back to see if he still needs his appointment tomorrow 06/29/2024 or not. Please advise.

## 2024-06-28 NOTE — Telephone Encounter (Signed)
 I spoke with Joshua Terrell on the phone today.  He notes that his drainage has improved from the ear.  He notes that some drainage in the morning when waking up which he feels is the drops from the night before but as the day progresses no further drainage, no fever, no pain.  He does have a scheduled appointment tomorrow I do think it is reasonable to extend this 1 more week he will continue using the otic drops, if he develops any new or worsening signs or symptoms he will reach out to me.  He is very happy with this plan.

## 2024-06-28 NOTE — Telephone Encounter (Signed)
Thanks - I spoke with him.  

## 2024-06-29 ENCOUNTER — Ambulatory Visit (INDEPENDENT_AMBULATORY_CARE_PROVIDER_SITE_OTHER): Admitting: Physician Assistant

## 2024-07-10 ENCOUNTER — Ambulatory Visit (INDEPENDENT_AMBULATORY_CARE_PROVIDER_SITE_OTHER): Admitting: Physician Assistant
